# Patient Record
Sex: Female | Born: 1965
Health system: Southern US, Community
[De-identification: ages and names within clinical notes are randomized; demographics above are authoritative.]

## PROBLEM LIST (undated history)

## (undated) DIAGNOSIS — I499 Cardiac arrhythmia, unspecified: Secondary | ICD-10-CM

## (undated) DIAGNOSIS — F32A Depression, unspecified: Secondary | ICD-10-CM

## (undated) DIAGNOSIS — Z87442 Personal history of urinary calculi: Secondary | ICD-10-CM

## (undated) DIAGNOSIS — G4730 Sleep apnea, unspecified: Secondary | ICD-10-CM

## (undated) DIAGNOSIS — K219 Gastro-esophageal reflux disease without esophagitis: Secondary | ICD-10-CM

## (undated) DIAGNOSIS — J302 Other seasonal allergic rhinitis: Secondary | ICD-10-CM

## (undated) DIAGNOSIS — E119 Type 2 diabetes mellitus without complications: Secondary | ICD-10-CM

## (undated) DIAGNOSIS — T7840XA Allergy, unspecified, initial encounter: Secondary | ICD-10-CM

## (undated) DIAGNOSIS — F329 Major depressive disorder, single episode, unspecified: Secondary | ICD-10-CM

## (undated) DIAGNOSIS — M5090 Cervical disc disorder, unspecified, unspecified cervical region: Secondary | ICD-10-CM

## (undated) DIAGNOSIS — T753XXA Motion sickness, initial encounter: Secondary | ICD-10-CM

## (undated) DIAGNOSIS — E785 Hyperlipidemia, unspecified: Secondary | ICD-10-CM

## (undated) DIAGNOSIS — N200 Calculus of kidney: Secondary | ICD-10-CM

## (undated) DIAGNOSIS — R002 Palpitations: Secondary | ICD-10-CM

## (undated) DIAGNOSIS — F419 Anxiety disorder, unspecified: Secondary | ICD-10-CM

## (undated) DIAGNOSIS — M51369 Other intervertebral disc degeneration, lumbar region without mention of lumbar back pain or lower extremity pain: Secondary | ICD-10-CM

## (undated) DIAGNOSIS — M5136 Other intervertebral disc degeneration, lumbar region: Secondary | ICD-10-CM

## (undated) HISTORY — DX: Major depressive disorder, single episode, unspecified: F32.9

## (undated) HISTORY — PX: OTHER SURGICAL HISTORY: SHX169

## (undated) HISTORY — DX: Gastro-esophageal reflux disease without esophagitis: K21.9

## (undated) HISTORY — DX: Depression, unspecified: F32.A

## (undated) HISTORY — DX: Sleep apnea, unspecified: G47.30

## (undated) HISTORY — DX: Cardiac arrhythmia, unspecified: I49.9

## (undated) HISTORY — DX: Type 2 diabetes mellitus without complications: E11.9

## (undated) HISTORY — DX: Hyperlipidemia, unspecified: E78.5

## (undated) HISTORY — DX: Anxiety disorder, unspecified: F41.9

## (undated) HISTORY — PX: JOINT REPLACEMENT: SHX530

## (undated) HISTORY — DX: Calculus of kidney: N20.0

## (undated) HISTORY — DX: Allergy, unspecified, initial encounter: T78.40XA

## (undated) HISTORY — PX: CYST EXCISION: SHX5701

---

## 2003-06-24 DIAGNOSIS — F4320 Adjustment disorder, unspecified: Secondary | ICD-10-CM | POA: Insufficient documentation

## 2003-06-24 DIAGNOSIS — F430 Acute stress reaction: Secondary | ICD-10-CM | POA: Insufficient documentation

## 2003-06-24 DIAGNOSIS — E7800 Pure hypercholesterolemia, unspecified: Secondary | ICD-10-CM | POA: Insufficient documentation

## 2005-05-23 HISTORY — PX: CYST EXCISION: SHX5701

## 2007-03-26 ENCOUNTER — Ambulatory Visit: Payer: Self-pay | Admitting: Family Medicine

## 2007-03-26 DIAGNOSIS — K5730 Diverticulosis of large intestine without perforation or abscess without bleeding: Secondary | ICD-10-CM | POA: Insufficient documentation

## 2007-04-09 DIAGNOSIS — K219 Gastro-esophageal reflux disease without esophagitis: Secondary | ICD-10-CM | POA: Insufficient documentation

## 2008-01-23 DIAGNOSIS — Z87891 Personal history of nicotine dependence: Secondary | ICD-10-CM | POA: Insufficient documentation

## 2008-01-23 DIAGNOSIS — Z720 Tobacco use: Secondary | ICD-10-CM | POA: Insufficient documentation

## 2009-01-15 ENCOUNTER — Ambulatory Visit: Payer: Self-pay | Admitting: Gastroenterology

## 2009-02-14 ENCOUNTER — Ambulatory Visit: Payer: Self-pay | Admitting: Unknown Physician Specialty

## 2009-03-27 ENCOUNTER — Ambulatory Visit: Payer: Self-pay | Admitting: Anesthesiology

## 2009-04-15 ENCOUNTER — Ambulatory Visit: Payer: Self-pay | Admitting: Anesthesiology

## 2009-05-21 ENCOUNTER — Ambulatory Visit: Payer: Self-pay | Admitting: Anesthesiology

## 2009-07-15 ENCOUNTER — Ambulatory Visit: Payer: Self-pay | Admitting: Anesthesiology

## 2013-06-19 LAB — HM PAP SMEAR: HM PAP: NEGATIVE

## 2013-10-28 ENCOUNTER — Encounter: Payer: Self-pay | Admitting: Podiatry

## 2013-11-04 ENCOUNTER — Ambulatory Visit (INDEPENDENT_AMBULATORY_CARE_PROVIDER_SITE_OTHER): Payer: BC Managed Care – PPO

## 2013-11-04 ENCOUNTER — Ambulatory Visit (INDEPENDENT_AMBULATORY_CARE_PROVIDER_SITE_OTHER): Payer: BC Managed Care – PPO | Admitting: Podiatry

## 2013-11-04 ENCOUNTER — Other Ambulatory Visit: Payer: Self-pay | Admitting: *Deleted

## 2013-11-04 ENCOUNTER — Encounter: Payer: Self-pay | Admitting: Podiatry

## 2013-11-04 VITALS — BP 113/67 | HR 92 | Resp 16 | Ht 66.0 in | Wt 225.0 lb

## 2013-11-04 DIAGNOSIS — M722 Plantar fascial fibromatosis: Secondary | ICD-10-CM

## 2013-11-04 MED ORDER — METHYLPREDNISOLONE (PAK) 4 MG PO TABS: ORAL_TABLET | ORAL | Status: DC

## 2013-11-04 MED ORDER — MELOXICAM 15 MG PO TABS: 15.0000 mg | ORAL_TABLET | Freq: Every day | ORAL | Status: DC

## 2013-11-04 NOTE — Progress Notes (Signed)
   Subjective:    Patient ID: Heather Orr, female    DOB: [birth year], 48 y.o.   MRN: [id number]  HPI Comments: Both my feet hurt and mainly it is the right foot. Its in the heels and on the side of my foot, its been going on since [place] or February. Constant hard pain. Eases a little bit when walking. Getting out of the bed is the worse   Foot Pain Associated symptoms include abdominal pain.      Review of Systems  HENT:       Ringing in the ears  Eyes: Positive for itching.  Cardiovascular: Positive for leg swelling.  Gastrointestinal: Positive for abdominal pain and diarrhea.  Musculoskeletal: Positive for back pain.       Muscle pain Difficulty walking   All other systems reviewed and are negative.      Objective:   Physical Exam: I have reviewed her past medical history medications allergies surgeries social history and review systems. Pulses are strongly palpable bilateral. Neurologic sensorium is intact per Semmes-Weinstein monofilament. Deep tendon reflexes are intact bilateral and equal. Muscle strength + over 5 dorsiflexors plantar flexors inverters everters all intrinsic musculature is intact. Orthopedic evaluation demonstrates all joints distal to the ankle a full range of motion without crepitation. She has pain on palpation medial calcaneal tubercle right foot greater than left. Radiographic evaluation does demonstrate a soft tissue increase in density at the plantar fascial calcaneal insertion site right greater than left.        Assessment & Plan:  Assessment: Plantar fasciitis right greater than left.  Plan: Injected the right heel today. Put her in a plantar fascial strapping. Wrote a prescription for Medrol Dosepak to be followed by meloxicam. Discussed appropriate shoe gear stretching exercises ice therapy and shoe gear modifications. I will followup with her in one month. We discussed appropriate shoe gear stretching exercises ice therapy shoe gear  modifications.

## 2013-11-04 NOTE — Patient Instructions (Signed)

## 2013-11-19 ENCOUNTER — Encounter: Payer: Self-pay | Admitting: *Deleted

## 2013-12-02 ENCOUNTER — Ambulatory Visit: Payer: BC Managed Care – PPO | Admitting: General Surgery

## 2013-12-02 ENCOUNTER — Encounter: Payer: Self-pay | Admitting: Podiatry

## 2013-12-02 ENCOUNTER — Ambulatory Visit (INDEPENDENT_AMBULATORY_CARE_PROVIDER_SITE_OTHER): Payer: BC Managed Care – PPO | Admitting: Podiatry

## 2013-12-02 VITALS — BP 135/76 | HR 89 | Resp 12

## 2013-12-02 DIAGNOSIS — M722 Plantar fascial fibromatosis: Secondary | ICD-10-CM

## 2013-12-02 NOTE — Progress Notes (Signed)
She presents today for followup of plantar fasciitis right foot. She states it is approximately 80% better but the lateral aspect of the foot is starting to hurt.  Objective: Pulses are palpable. She's been on palpation medial continued tubercle and pain on palpation to the lateral calcaneal tubercle indicative of plantar fasciitis with lateral compensatory syndrome.  Assessment: Plantar fasciitis slowly responding right with lateral compensatory syndrome right.  Plan: Reinjected right heel today continue all conservative therapies followup with me in one month.

## 2013-12-10 ENCOUNTER — Encounter: Payer: Self-pay | Admitting: General Surgery

## 2013-12-10 ENCOUNTER — Ambulatory Visit (INDEPENDENT_AMBULATORY_CARE_PROVIDER_SITE_OTHER): Payer: BC Managed Care – PPO | Admitting: General Surgery

## 2013-12-10 VITALS — BP 174/94 | HR 82 | Resp 12 | Ht 66.0 in | Wt 224.0 lb

## 2013-12-10 DIAGNOSIS — D179 Benign lipomatous neoplasm, unspecified: Secondary | ICD-10-CM

## 2013-12-10 NOTE — Patient Instructions (Signed)
Patient advised to start monthly breast checks. Patient to return as needed.The patient is aware to call back for any questions or concerns.
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
## 2013-12-11 DIAGNOSIS — D179 Benign lipomatous neoplasm, unspecified: Secondary | ICD-10-CM | POA: Insufficient documentation

## 2013-12-30 ENCOUNTER — Ambulatory Visit: Payer: BC Managed Care – PPO | Admitting: Podiatry

## 2013-12-31 ENCOUNTER — Encounter: Payer: Self-pay | Admitting: General Surgery

## 2014-03-24 ENCOUNTER — Encounter: Payer: Self-pay | Admitting: General Surgery

## 2014-11-19 ENCOUNTER — Other Ambulatory Visit: Payer: Self-pay | Admitting: Podiatry

## 2014-11-19 NOTE — Telephone Encounter (Signed)
Pt will need an appt prior to future refills.

## 2014-12-24 ENCOUNTER — Ambulatory Visit (INDEPENDENT_AMBULATORY_CARE_PROVIDER_SITE_OTHER): Payer: BLUE CROSS/BLUE SHIELD | Admitting: Podiatry

## 2014-12-24 ENCOUNTER — Ambulatory Visit (INDEPENDENT_AMBULATORY_CARE_PROVIDER_SITE_OTHER): Payer: BLUE CROSS/BLUE SHIELD

## 2014-12-24 VITALS — BP 122/75 | HR 84 | Resp 16

## 2014-12-24 DIAGNOSIS — M722 Plantar fascial fibromatosis: Secondary | ICD-10-CM

## 2014-12-24 DIAGNOSIS — Q6652 Congenital pes planus, left foot: Secondary | ICD-10-CM | POA: Diagnosis not present

## 2014-12-24 DIAGNOSIS — M76822 Posterior tibial tendinitis, left leg: Secondary | ICD-10-CM

## 2014-12-24 NOTE — Progress Notes (Signed)
She presents today for follow-up of her left foot and ankle pain. She states that her left ankle is started swelling and it has been for a while. She states is tender right in here and she points to the posterior tibial tendon as it courses beneath the medial malleolus. She also states that she still has heel pain. She continues to use her night splint as well as her plantar fascial brace. She continues the use of meloxicam.  Objective: Vital signs are stable she is alert and oriented 3. Pulses are palpable. She has pain on palpation of the posterior tibial tendon as well as pain on palpation medial calcaneal tubercle.  Assessment: Plantar fasciitis left with compensatory posterior tibial tendinitis left.  Plan: Discussed etiology pathology conservative versus surgical therapies. At this point I injected the posterior tibial tendon sheath with dexamethasone and local and aesthetic as well as Kenalog and local and aesthetic to the plantar fascial calcaneal insertion site left. She will continue all conservative therapies and consider custom molded orthotics follow up with her in 1 month if necessary

## 2014-12-29 ENCOUNTER — Other Ambulatory Visit: Payer: Self-pay | Admitting: Family Medicine

## 2014-12-29 DIAGNOSIS — F419 Anxiety disorder, unspecified: Secondary | ICD-10-CM

## 2014-12-29 NOTE — Telephone Encounter (Signed)
Last OV 10/2013  Thanks,   -[place] Marchiano  

## 2014-12-29 NOTE — Telephone Encounter (Signed)
Ok to call in rx as above. Needs ov before next refill. Thanks.

## 2014-12-29 NOTE — Telephone Encounter (Signed)
OK to refill as above. E

## 2015-01-21 ENCOUNTER — Ambulatory Visit (INDEPENDENT_AMBULATORY_CARE_PROVIDER_SITE_OTHER): Payer: BLUE CROSS/BLUE SHIELD | Admitting: Podiatry

## 2015-01-21 ENCOUNTER — Encounter: Payer: Self-pay | Admitting: Podiatry

## 2015-01-21 VITALS — BP 136/77 | HR 86 | Resp 16

## 2015-01-21 DIAGNOSIS — M722 Plantar fascial fibromatosis: Secondary | ICD-10-CM | POA: Diagnosis not present

## 2015-01-21 MED ORDER — DICLOFENAC SODIUM 75 MG PO TBEC: 75.0000 mg | DELAYED_RELEASE_TABLET | Freq: Two times a day (BID) | ORAL | Status: DC

## 2015-01-21 NOTE — Progress Notes (Signed)
She presents today for follow-up of her plantar fasciitis left posterior tibial tendinitis left. She states that I guess it's getting better. She states she started to have pain in her right heel now. She denies any trauma. Rates that she continues to take her medication and use her sling.  Objective: Vital signs are stable she is alert and oriented 3. Pulses are strongly palpable bilateral. Neurologic sensorium is intact percent YC monofilament. Deep tendon reflexes are intact bilateral muscle strength +5 over 5 dorsiflexion plantar flexors and inverters everters all his musculature is intact. Orthopedic evaluation of his wrist pain on palpation medial calcaneal tubercles bilateral. Mild tenderness on palpation of the posterior tibial tendon left.  Assessment: Plantar fasciitis bilateral compensatory posterior tibial tendinitis left.  Plan: Reinjected bilateral heels today with Kenalog multiple aesthetic continue plantar fascial braces and night splint. Continue non-steroidal anti-inflammatory. Follow-up with her 1 month.

## 2015-02-18 ENCOUNTER — Ambulatory Visit: Payer: BLUE CROSS/BLUE SHIELD | Admitting: Podiatry

## 2015-02-24 ENCOUNTER — Other Ambulatory Visit: Payer: Self-pay | Admitting: Family Medicine

## 2015-02-24 DIAGNOSIS — F4322 Adjustment disorder with anxiety: Secondary | ICD-10-CM

## 2015-02-24 NOTE — Telephone Encounter (Signed)
Last OV 10/2013  Thanks,   -Kadeja Granada  

## 2015-05-19 ENCOUNTER — Other Ambulatory Visit: Payer: Self-pay

## 2015-05-19 DIAGNOSIS — N926 Irregular menstruation, unspecified: Secondary | ICD-10-CM | POA: Insufficient documentation

## 2015-05-19 DIAGNOSIS — M722 Plantar fascial fibromatosis: Secondary | ICD-10-CM | POA: Insufficient documentation

## 2015-05-19 DIAGNOSIS — L301 Dyshidrosis [pompholyx]: Secondary | ICD-10-CM | POA: Insufficient documentation

## 2015-05-20 ENCOUNTER — Encounter: Payer: Self-pay | Admitting: Physician Assistant

## 2015-05-20 ENCOUNTER — Ambulatory Visit (INDEPENDENT_AMBULATORY_CARE_PROVIDER_SITE_OTHER): Payer: BLUE CROSS/BLUE SHIELD | Admitting: Physician Assistant

## 2015-05-20 VITALS — BP 124/82 | HR 84 | Temp 98.8°F | Resp 16 | Wt 226.0 lb

## 2015-05-20 DIAGNOSIS — G4700 Insomnia, unspecified: Secondary | ICD-10-CM | POA: Diagnosis not present

## 2015-05-20 DIAGNOSIS — W540XXA Bitten by dog, initial encounter: Secondary | ICD-10-CM | POA: Diagnosis not present

## 2015-05-20 DIAGNOSIS — Z23 Encounter for immunization: Secondary | ICD-10-CM | POA: Diagnosis not present

## 2015-05-20 DIAGNOSIS — S91352A Open bite, left foot, initial encounter: Secondary | ICD-10-CM | POA: Diagnosis not present

## 2015-05-20 MED ORDER — TRAZODONE HCL 150 MG PO TABS: 150.0000 mg | ORAL_TABLET | Freq: Every evening | ORAL | Status: DC | PRN

## 2015-05-20 MED ORDER — DOXYCYCLINE HYCLATE 100 MG PO TABS: 100.0000 mg | ORAL_TABLET | Freq: Two times a day (BID) | ORAL | Status: DC

## 2015-05-20 MED ORDER — SILVER SULFADIAZINE 1 % EX CREA: 1.0000 "application " | TOPICAL_CREAM | Freq: Every day | CUTANEOUS | Status: DC

## 2015-05-20 NOTE — Progress Notes (Signed)
Patient ID: Heather Orr, female   DOB: 03/26/1966, 49 y.o.   MRN: [id number]       [name] Female    DOB: February 25, 1966   49 y.o.   MRN: [id number] Visit Date: 05/20/2015  Today's Provider: Mar Daring, PA-C   Chief Complaint  Patient presents with  . Animal Bite   Subjective:    HPI  Patient had a dog bite on December 25th, her dogs were fighting and her left foot got injured. Dogs are up to date on their immunizations. She can not recall when she had last tetanus shot. Her left foot is red, swollen, some pus present, pain is present and pain is radiating up to her left lower leg. No fever that she can tell. She has been keeping area clean with soap, alcohol rub and peroxide. She has been wrapping her left foot. She used RX antibiotic that her girlfriend had after been bit by a cat, she has also used wound care spray. She has noticed some odor from discharge from wound.    Allergies  Allergen Reactions  . Nexium [Esomeprazole Magnesium] Rash   Previous Medications   ALPRAZOLAM (XANAX) 0.25 MG TABLET    TAKE 1 TABLET BY MOUTH EVERY 8 HOURS AS NEEDED   CITALOPRAM (CELEXA) 40 MG TABLET    TAKE 1 TABLET BY MOUTH EVERYDAY   FLUOCINONIDE EX       GLUCOSAMINE-CHONDROIT-VIT C-MN (FLEX-DS PO)    Take by mouth.   MELOXICAM (MOBIC) 15 MG TABLET    TAKE 1 TABLET (15 MG TOTAL) BY MOUTH DAILY.   OMEGA-3 FATTY ACIDS (OMEGA 3 PO)    Take 1 capsule by mouth daily.   OXYCODONE-ACETAMINOPHEN (PERCOCET/ROXICET) 5-325 MG TABLET    Take by mouth. Reported on 05/20/2015   RANITIDINE (ZANTAC) 150 MG TABLET    Take by mouth. Reported on 05/20/2015   TRAZODONE (DESYREL) 150 MG TABLET       VITAMIN D, CHOLECALCIFEROL, PO    Take 4,000 mg by mouth daily.    Review of Systems  Constitutional: Negative.   Respiratory: Negative.   Cardiovascular: Negative.   Musculoskeletal: Positive for joint swelling and arthralgias.  Skin: Positive for color change and wound.    Social History    Substance Use Topics  . Smoking status: Current Every Day Smoker -- 1.00 packs/day for 25 years    Types: Cigarettes  . Smokeless tobacco: Never Used  . Alcohol Use: Yes   Objective:   BP 124/82 mmHg  Pulse 84  Temp(Src) 98.8 F (37.1 C)  Resp 16  Wt 226 lb (102.513 kg)  Physical Exam  Constitutional: She appears well-developed and well-nourished. No distress.  Cardiovascular: Normal rate, regular rhythm, normal heart sounds and intact distal pulses.  Exam reveals no gallop and no friction rub.   No murmur heard. Pulmonary/Chest: Effort normal and breath sounds normal. No respiratory distress. She has no wheezes. She has no rales.  Skin: Laceration noted. She is not diaphoretic. There is erythema.     Vitals reviewed.       Assessment & Plan:     1. Dog bite of foot, left, initial encounter Wound culture was obtained. Wound was then debrided of a eschar tissue using forceps and scissors. Silver nitrate was then used in the wound. The wound was then cleaned and pat dry. I then placed silver cream into the wound and covered with a nonadherent pad and wrapped with Coban. I will also get  doxycycline as below because of the signs of infection. I advised her how to cleanse the wound with warm soap and water. I also advised her to not use the antibiotic ointment or peroxide on the wound any longer. I did prescribe Silvadene cream as below she is to put this on the wound after washing and cleansing the wound. I will see her back in 2 weeks to reevaluate. She is to call the office if the area worsens, discharge increases, pain increases or if she develops systemic signs of infection such as fever, chills, nausea or vomiting. - Wound culture - silver sulfADIAZINE (SILVADENE) 1 % cream; Apply 1 application topically daily.  Dispense: 50 g; Refill: 0 - doxycycline (VIBRA-TABS) 100 MG tablet; Take 1 tablet (100 mg total) by mouth 2 (two) times daily.  Dispense: 20 tablet; Refill: 0  2. Need  for diphtheria-tetanus-pertussis (Tdap) vaccine T death vaccine was given without complication for the dog bite. - Tdap vaccine greater than or equal to 7yo IM  3. Insomnia Stable currently. Diagnosis was pulled to refill trazodone as below. - traZODone (DESYREL) 150 MG tablet; Take 1 tablet (150 mg total) by mouth at bedtime as needed for sleep.  Dispense: 30 tablet; Refill: [place], PA-C  [facility]

## 2015-05-20 NOTE — Patient Instructions (Signed)

## 2015-05-23 LAB — WOUND CULTURE

## 2015-05-26 ENCOUNTER — Telehealth: Payer: Self-pay

## 2015-05-26 NOTE — Telephone Encounter (Signed)
Lost connection, patient will call back.

## 2015-05-26 NOTE — Telephone Encounter (Signed)
Advised patient of results.  

## 2015-05-26 NOTE — Telephone Encounter (Signed)
-----   Message from Mar Daring, [place] sent at 05/26/2015  9:23 AM EST ----- Culture grew out few gram-negative rods as well as just normal skin flora. Continue current antibiotic treatment until completed.

## 2015-06-03 ENCOUNTER — Ambulatory Visit: Payer: BLUE CROSS/BLUE SHIELD | Admitting: Physician Assistant

## 2015-08-14 ENCOUNTER — Encounter: Payer: Self-pay | Admitting: Family Medicine

## 2015-08-14 ENCOUNTER — Ambulatory Visit (INDEPENDENT_AMBULATORY_CARE_PROVIDER_SITE_OTHER): Payer: BLUE CROSS/BLUE SHIELD | Admitting: Family Medicine

## 2015-08-14 VITALS — BP 116/74 | HR 72 | Temp 97.8°F | Resp 16 | Ht 66.0 in | Wt 238.0 lb

## 2015-08-14 DIAGNOSIS — Z1239 Encounter for other screening for malignant neoplasm of breast: Secondary | ICD-10-CM

## 2015-08-14 DIAGNOSIS — Z1211 Encounter for screening for malignant neoplasm of colon: Secondary | ICD-10-CM | POA: Diagnosis not present

## 2015-08-14 DIAGNOSIS — E7800 Pure hypercholesterolemia, unspecified: Secondary | ICD-10-CM

## 2015-08-14 DIAGNOSIS — J3089 Other allergic rhinitis: Secondary | ICD-10-CM | POA: Diagnosis not present

## 2015-08-14 DIAGNOSIS — R0789 Other chest pain: Secondary | ICD-10-CM | POA: Diagnosis not present

## 2015-08-14 DIAGNOSIS — M1990 Unspecified osteoarthritis, unspecified site: Secondary | ICD-10-CM

## 2015-08-14 DIAGNOSIS — Z0000 Encounter for general adult medical examination without abnormal findings: Secondary | ICD-10-CM

## 2015-08-14 DIAGNOSIS — Z720 Tobacco use: Secondary | ICD-10-CM

## 2015-08-14 LAB — POCT URINALYSIS DIPSTICK
Bilirubin, UA: NEGATIVE
Glucose, UA: NEGATIVE
KETONES UA: NEGATIVE
Leukocytes, UA: NEGATIVE
Nitrite, UA: NEGATIVE
PH UA: 6
PROTEIN UA: NEGATIVE
RBC UA: NEGATIVE
SPEC GRAV UA: 1.01
UROBILINOGEN UA: 0.2

## 2015-08-14 MED ORDER — NAPROXEN 500 MG PO TABS: 500.0000 mg | ORAL_TABLET | Freq: Two times a day (BID) | ORAL | Status: DC

## 2015-08-14 MED ORDER — FLUTICASONE PROPIONATE 50 MCG/ACT NA SUSP: 2.0000 | Freq: Every day | NASAL | Status: DC
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
## 2015-08-28 ENCOUNTER — Other Ambulatory Visit: Payer: Self-pay

## 2015-08-28 ENCOUNTER — Telehealth: Payer: Self-pay

## 2015-08-28 NOTE — Telephone Encounter (Signed)
Gastroenterology Pre-Procedure Review  Request Date: 09/28/15 Requesting Physician: Dr. Venia Minks   PATIENT REVIEW QUESTIONS: The patient responded to the following health history questions as indicated:    1. Are you having any GI issues? no 2. Do you have a personal history of Polyps? no 3. Do you have a family history of Colon Cancer or Polyps? no 4. Diabetes Mellitus? no 5. Joint replacements in the past 12 months?no 6. Major health problems in the past 3 months?no 7. Any artificial heart valves, MVP, or defibrillator?no    MEDICATIONS & ALLERGIES:    Patient reports the following regarding taking any anticoagulation/antiplatelet therapy:   Plavix, Coumadin, Eliquis, Xarelto, Lovenox, Pradaxa, Brilinta, or Effient? no Aspirin? no  Patient confirms/reports the following medications:  Current Outpatient Prescriptions  Medication Sig Dispense Refill  . ALPRAZolam (XANAX) 0.25 MG tablet TAKE 1 TABLET BY MOUTH EVERY 8 HOURS AS NEEDED 30 tablet 0  . citalopram (CELEXA) 40 MG tablet TAKE 1 TABLET BY MOUTH EVERYDAY 30 tablet 0  . Flaxseed, Linseed, (FLAX SEED OIL) 1000 MG CAPS Take 1 capsule by mouth daily.    . fluticasone (FLONASE) 50 MCG/ACT nasal spray Place 2 sprays into both nostrils daily. 16 g 6  . meloxicam (MOBIC) 15 MG tablet TAKE 1 TABLET (15 MG TOTAL) BY MOUTH DAILY. 30 tablet 5  . naproxen (NAPROSYN) 500 MG tablet Take 1 tablet (500 mg total) by mouth 2 (two) times daily with a meal. 30 tablet 5  . Omega-3 Fatty Acids (OMEGA 3 PO) Take 1 capsule by mouth daily.    Marland Kitchen oxyCODONE-acetaminophen (PERCOCET/ROXICET) 5-325 MG tablet Take 1 tablet by mouth as needed. Reported on 05/20/2015    . traZODone (DESYREL) 150 MG tablet Take 1 tablet (150 mg total) by mouth at bedtime as needed for sleep. 30 tablet 5  . VITAMIN D, CHOLECALCIFEROL, PO Take 4,000 mg by mouth daily.     No current facility-administered medications for this visit.    Patient confirms/reports the following  allergies:  Allergies  Allergen Reactions  . Nexium [Esomeprazole Magnesium] Rash    No orders of the defined types were placed in this encounter.    AUTHORIZATION INFORMATION Primary Insurance: 1D#: Group #:  Secondary Insurance: 1D#: Group #:  SCHEDULE INFORMATION: Date: 09/28/15 Time: Location: [place]

## 2015-09-03 ENCOUNTER — Encounter: Payer: Self-pay | Admitting: Family Medicine

## 2015-09-04 LAB — LIPID PANEL WITH LDL/HDL RATIO
CHOLESTEROL TOTAL: 227 mg/dL — AB (ref 100–199)
HDL: 44 mg/dL (ref >39)
LDL Calculated: 141 mg/dL — ABNORMAL HIGH (ref 0–99)
LDL/HDL RATIO: 3.2 ratio (ref 0.0–3.2)
TRIGLYCERIDES: 208 mg/dL — AB (ref 0–149)
VLDL Cholesterol Cal: 42 mg/dL — ABNORMAL HIGH (ref 5–40)

## 2015-09-04 LAB — CBC WITH DIFFERENTIAL/PLATELET
Basophils Absolute: 0 10*3/uL (ref 0.0–0.2)
Basos: 0 %
EOS (ABSOLUTE): 0.2 10*3/uL (ref 0.0–0.4)
Eos: 2 %
Hematocrit: 44.5 % (ref 34.0–46.6)
Hemoglobin: 15.2 g/dL (ref 11.1–15.9)
IMMATURE GRANULOCYTES: 0 %
Immature Grans (Abs): 0 10*3/uL (ref 0.0–0.1)
Lymphocytes Absolute: 3.9 10*3/uL — ABNORMAL HIGH (ref 0.7–3.1)
Lymphs: 46 %
MCH: 30.7 pg (ref 26.6–33.0)
MCHC: 34.2 g/dL (ref 31.5–35.7)
MCV: 90 fL (ref 79–97)
MONOS ABS: 0.5 10*3/uL (ref 0.1–0.9)
Monocytes: 6 %
NEUTROS PCT: 46 %
Neutrophils Absolute: 3.9 10*3/uL (ref 1.4–7.0)
PLATELETS: 350 10*3/uL (ref 150–379)
RBC: 4.95 x10E6/uL (ref 3.77–5.28)
RDW: 13.3 % (ref 12.3–15.4)
WBC: 8.5 10*3/uL (ref 3.4–10.8)

## 2015-09-04 LAB — COMPREHENSIVE METABOLIC PANEL
A/G RATIO: 1.6 (ref 1.2–2.2)
ALK PHOS: 70 IU/L (ref 39–117)
ALT: 28 IU/L (ref 0–32)
AST: 18 IU/L (ref 0–40)
Albumin: 4 g/dL (ref 3.5–5.5)
BUN/Creatinine Ratio: 21 (ref 9–23)
BUN: 13 mg/dL (ref 6–24)
Bilirubin Total: 0.3 mg/dL (ref 0.0–1.2)
CALCIUM: 9.9 mg/dL (ref 8.7–10.2)
CO2: 25 mmol/L (ref 18–29)
CREATININE: 0.62 mg/dL (ref 0.57–1.00)
Chloride: 101 mmol/L (ref 96–106)
GFR calc Af Amer: 122 mL/min/{1.73_m2} (ref >59)
GFR, EST NON AFRICAN AMERICAN: 105 mL/min/{1.73_m2} (ref >59)
Globulin, Total: 2.5 g/dL (ref 1.5–4.5)
Glucose: 94 mg/dL (ref 65–99)
POTASSIUM: 4.8 mmol/L (ref 3.5–5.2)
Sodium: 142 mmol/L (ref 134–144)
Total Protein: 6.5 g/dL (ref 6.0–8.5)

## 2015-09-04 LAB — TSH: TSH: 1.23 u[IU]/mL (ref 0.450–4.500)

## 2015-09-07 ENCOUNTER — Telehealth: Payer: Self-pay

## 2015-09-07 NOTE — Telephone Encounter (Signed)
-----   Message from Margarita Rana, MD sent at 09/06/2015  7:13 AM EDT ----- Labs stable. Cholesterol is elevated with 10 year risk of heart disease or 5 percent. Not high enough to recommend a medication, but  Could be much lower with healthy diet, exercise and no smoking.Recheck annually.  Thanks.

## 2015-09-07 NOTE — Telephone Encounter (Signed)
Pt advised.   Thanks,   -Laura  

## 2015-09-30 ENCOUNTER — Encounter: Payer: Self-pay | Admitting: *Deleted

## 2015-10-01 NOTE — Discharge Instructions (Signed)

## 2015-10-05 ENCOUNTER — Encounter: Payer: Self-pay | Admitting: *Deleted

## 2015-10-05 ENCOUNTER — Encounter
Admission: RE | Disposition: A | Discharge status: Home / Self Care | Payer: Self-pay | Source: Ambulatory Visit | Attending: Gastroenterology

## 2015-10-05 ENCOUNTER — Ambulatory Visit: Payer: BLUE CROSS/BLUE SHIELD | Admitting: Anesthesiology

## 2015-10-05 ENCOUNTER — Ambulatory Visit
Admission: RE | Admit: 2015-10-05 | Discharge: 2015-10-05 | Disposition: A | Discharge status: Home / Self Care | Payer: BLUE CROSS/BLUE SHIELD | Source: Ambulatory Visit | Attending: Gastroenterology | Admitting: Gastroenterology

## 2015-10-05 DIAGNOSIS — M5136 Other intervertebral disc degeneration, lumbar region: Secondary | ICD-10-CM | POA: Diagnosis not present

## 2015-10-05 DIAGNOSIS — Z7951 Long term (current) use of inhaled steroids: Secondary | ICD-10-CM | POA: Insufficient documentation

## 2015-10-05 DIAGNOSIS — Z79891 Long term (current) use of opiate analgesic: Secondary | ICD-10-CM | POA: Insufficient documentation

## 2015-10-05 DIAGNOSIS — Z818 Family history of other mental and behavioral disorders: Secondary | ICD-10-CM | POA: Diagnosis not present

## 2015-10-05 DIAGNOSIS — Z79899 Other long term (current) drug therapy: Secondary | ICD-10-CM | POA: Diagnosis not present

## 2015-10-05 DIAGNOSIS — Z791 Long term (current) use of non-steroidal anti-inflammatories (NSAID): Secondary | ICD-10-CM | POA: Diagnosis not present

## 2015-10-05 DIAGNOSIS — Z8249 Family history of ischemic heart disease and other diseases of the circulatory system: Secondary | ICD-10-CM | POA: Diagnosis not present

## 2015-10-05 DIAGNOSIS — D125 Benign neoplasm of sigmoid colon: Secondary | ICD-10-CM | POA: Diagnosis not present

## 2015-10-05 DIAGNOSIS — K5730 Diverticulosis of large intestine without perforation or abscess without bleeding: Secondary | ICD-10-CM | POA: Diagnosis not present

## 2015-10-05 DIAGNOSIS — F17210 Nicotine dependence, cigarettes, uncomplicated: Secondary | ICD-10-CM | POA: Diagnosis not present

## 2015-10-05 DIAGNOSIS — Z1211 Encounter for screening for malignant neoplasm of colon: Secondary | ICD-10-CM | POA: Insufficient documentation

## 2015-10-05 DIAGNOSIS — Z833 Family history of diabetes mellitus: Secondary | ICD-10-CM | POA: Insufficient documentation

## 2015-10-05 DIAGNOSIS — K641 Second degree hemorrhoids: Secondary | ICD-10-CM | POA: Insufficient documentation

## 2015-10-05 DIAGNOSIS — F329 Major depressive disorder, single episode, unspecified: Secondary | ICD-10-CM | POA: Insufficient documentation

## 2015-10-05 HISTORY — PX: COLONOSCOPY WITH PROPOFOL: SHX5780

## 2015-10-05 HISTORY — DX: Cervical disc disorder, unspecified, unspecified cervical region: M50.90

## 2015-10-05 HISTORY — PX: POLYPECTOMY: SHX5525

## 2015-10-05 HISTORY — DX: Other intervertebral disc degeneration, lumbar region: M51.36

## 2015-10-05 HISTORY — DX: Other intervertebral disc degeneration, lumbar region without mention of lumbar back pain or lower extremity pain: M51.369

## 2015-10-05 HISTORY — DX: Motion sickness, initial encounter: T75.3XXA

## 2015-10-05 SURGERY — COLONOSCOPY WITH PROPOFOL
Anesthesia: Monitor Anesthesia Care

## 2015-10-05 MED ORDER — LACTATED RINGERS IV SOLN
INTRAVENOUS | Status: DC
  Administered 2015-10-05: 10:00:00 via INTRAVENOUS

## 2015-10-05 MED ORDER — LIDOCAINE HCL (CARDIAC) 20 MG/ML IV SOLN
INTRAVENOUS | Status: DC | PRN
  Administered 2015-10-05: 20 mg via INTRAVENOUS

## 2015-10-05 MED ORDER — PROPOFOL 10 MG/ML IV BOLUS
INTRAVENOUS | Status: DC | PRN
  Administered 2015-10-05: 25 mg via INTRAVENOUS
  Administered 2015-10-05: 100 mg via INTRAVENOUS
  Administered 2015-10-05: 50 mg via INTRAVENOUS
  Administered 2015-10-05: 25 mg via INTRAVENOUS

## 2015-10-05 MED ORDER — STERILE WATER FOR IRRIGATION IR SOLN
Status: DC | PRN
  Administered 2015-10-05: 10:00:00

## 2015-10-05 SURGICAL SUPPLY — 22 items
CANISTER SUCT 1200ML W/VALVE (MISCELLANEOUS) ×3 IMPLANT
CLIP HMST 235XBRD CATH ROT (MISCELLANEOUS) IMPLANT
CLIP RESOLUTION 360 11X235 (MISCELLANEOUS)
FCP ESCP3.2XJMB 240X2.8X (MISCELLANEOUS)
FORCEPS BIOP RAD 4 LRG CAP 4 (CUTTING FORCEPS) IMPLANT
FORCEPS BIOP RJ4 240 W/NDL (MISCELLANEOUS)
FORCEPS ESCP3.2XJMB 240X2.8X (MISCELLANEOUS) IMPLANT
GOWN CVR UNV OPN BCK APRN NK (MISCELLANEOUS) ×4 IMPLANT
GOWN ISOL THUMB LOOP REG UNIV (MISCELLANEOUS) ×2
INJECTOR VARIJECT VIN23 (MISCELLANEOUS) IMPLANT
KIT DEFENDO VALVE AND CONN (KITS) IMPLANT
KIT ENDO PROCEDURE OLY (KITS) ×3 IMPLANT
MARKER SPOT ENDO TATTOO 5ML (MISCELLANEOUS) IMPLANT
PAD GROUND ADULT SPLIT (MISCELLANEOUS) IMPLANT
PROBE APC STR FIRE (PROBE) IMPLANT
SNARE SHORT THROW 13M SML OVAL (MISCELLANEOUS) IMPLANT
SNARE SHORT THROW 30M LRG OVAL (MISCELLANEOUS) ×3 IMPLANT
SNARE SNG USE RND 15MM (INSTRUMENTS) IMPLANT
SPOT EX ENDOSCOPIC TATTOO (MISCELLANEOUS)
TRAP ETRAP POLY (MISCELLANEOUS) ×6 IMPLANT
VARIJECT INJECTOR VIN23 (MISCELLANEOUS)
WATER STERILE IRR 250ML POUR (IV SOLUTION) ×3 IMPLANT

## 2015-10-05 NOTE — Transfer of Care (Signed)
Immediate Anesthesia Transfer of Care Note  [name]  Procedure(s) Performed: Procedure(s): COLONOSCOPY WITH PROPOFOL (N/A) POLYPECTOMY  Patient Location: PACU  Anesthesia Type: MAC  Level of Consciousness: awake, alert  and patient cooperative  Airway and Oxygen Therapy: Patient Spontanous Breathing and Patient connected to supplemental oxygen  Post-op Assessment: Post-op Vital signs reviewed, Patient's Cardiovascular Status Stable, Respiratory Function Stable, Patent Airway and No signs of Nausea or vomiting  Post-op Vital Signs: Reviewed and stable  Complications: No apparent anesthesia complications

## 2015-10-05 NOTE — Anesthesia Postprocedure Evaluation (Signed)
Anesthesia Post Note  [name]  Procedure(s) Performed: Procedure(s) (LRB): COLONOSCOPY WITH PROPOFOL (N/A) POLYPECTOMY  Patient location during evaluation: PACU Anesthesia Type: MAC Level of consciousness: awake and alert Pain management: pain level controlled Vital Signs Assessment: post-procedure vital signs reviewed and stable Respiratory status: spontaneous breathing, nonlabored ventilation, respiratory function stable and patient connected to nasal cannula oxygen Cardiovascular status: stable and blood pressure returned to baseline Anesthetic complications: no    Leaann Nevils C
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
## 2015-10-05 NOTE — Anesthesia Procedure Notes (Signed)
Procedure Name: MAC Performed by: Theora Vankirk Pre-anesthesia Checklist: Patient identified, Emergency Drugs available, Suction available, Patient being monitored and Timeout performed Patient Re-evaluated:Patient Re-evaluated prior to inductionOxygen Delivery Method: Nasal cannula       

## 2015-10-05 NOTE — Op Note (Signed)
[facility] Gastroenterology Patient Name: Heather Orr Procedure Date: 10/05/2015 9:46 AM MRN: [id number] Account #: [id number] Date of Birth: 08/10/1965 Admit Type: Outpatient Age: 50 Room: [facility] OR ROOM 01 Gender: Female Note Status: Finalized Procedure:            Colonoscopy Indications:          Screening for colorectal malignant neoplasm Providers:            Lucilla Lame, MD Referring MD:         Jerrell Belfast, MD (Referring MD) Medicines:            Propofol per Anesthesia Complications:        No immediate complications. Procedure:            Pre-Anesthesia Assessment:                       - Prior to the procedure, a History and Physical was                        performed, and patient medications and allergies were                        reviewed. The patient's tolerance of previous                        anesthesia was also reviewed. The risks and benefits of                        the procedure and the sedation options and risks were                        discussed with the patient. All questions were                        answered, and informed consent was obtained. Prior                        Anticoagulants: The patient has taken no previous                        anticoagulant or antiplatelet agents. ASA Grade                        Assessment: II - A patient with mild systemic disease.                        After reviewing the risks and benefits, the patient was                        deemed in satisfactory condition to undergo the                        procedure.                       After obtaining informed consent, the colonoscope was                        passed under direct vision. Throughout the procedure,  the patient's blood pressure, pulse, and oxygen                        saturations were monitored continuously. The Olympus                        CF-HQ190L Colonoscope (S#. [id number]) was introduced                         through the anus and advanced to the the cecum,                        identified by appendiceal orifice and ileocecal valve.                        The colonoscopy was performed without difficulty. The                        patient tolerated the procedure well. The quality of                        the bowel preparation was excellent. Findings:      The perianal and digital rectal examinations were normal.      Multiple small-mouthed diverticula were found in the entire colon.      Three sessile polyps were found in the sigmoid colon. The polyps were 5       to 9 mm in size. These polyps were removed with a cold snare. Resection       and retrieval were complete.      Non-bleeding internal hemorrhoids were found during retroflexion. The       hemorrhoids were Grade II (internal hemorrhoids that prolapse but reduce       spontaneously). Impression:           - Diverticulosis in the entire examined colon.                       - Three 5 to 9 mm polyps in the sigmoid colon, removed                        with a cold snare. Resected and retrieved.                       - Non-bleeding internal hemorrhoids. Recommendation:       - Await pathology results.                       - Repeat colonoscopy in 5 years if polyp adenoma and 10                        years if hyperplastic Procedure Code(s):    --- Professional ---                       [phone], Colonoscopy, flexible; with removal of tumor(s),                        polyp(s), or other lesion(s) by snare technique Diagnosis Code(s):    --- Professional ---  Z12.11, Encounter for screening for malignant neoplasm                        of colon                       D12.5, Benign neoplasm of sigmoid colon CPT copyright 2016 American Medical Association. All rights reserved. The codes documented in this report are preliminary and upon coder review may  be revised to meet current compliance  requirements. Lucilla Lame, MD 10/05/2015 10:16:03 AM This report has been signed electronically. Number of Addenda: 0 Note Initiated On: 10/05/2015 9:46 AM Scope Withdrawal Time: 0 hours 7 minutes 24 seconds  Total Procedure Duration: 0 hours 12 minutes 18 seconds       [facility]

## 2015-10-05 NOTE — Anesthesia Preprocedure Evaluation (Signed)
Anesthesia Evaluation  Patient identified by MRN, date of birth, ID band Patient awake    Reviewed: Allergy & Precautions, H&P , NPO status , Patient's Chart, lab work & pertinent test results, reviewed documented beta blocker date and time   Airway Mallampati: II  TM Distance: >3 FB Neck ROM: full    Dental no notable dental hx.    Pulmonary neg pulmonary ROS, Current Smoker,    Pulmonary exam normal breath sounds clear to auscultation       Cardiovascular Exercise Tolerance: Good negative cardio ROS   Rhythm:regular Rate:Normal     Neuro/Psych Depression negative neurological ROS  negative psych ROS   GI/Hepatic negative GI ROS, Neg liver ROS, GERD  Controlled,  Endo/Other  negative endocrine ROS  Renal/GU negative Renal ROSKidney stones  negative genitourinary   Musculoskeletal  (+) Arthritis ,   Abdominal   Peds negative pediatric ROS (+)  Hematology negative hematology ROS (+)   Anesthesia Other Findings   Reproductive/Obstetrics negative OB ROS                             Anesthesia Physical Anesthesia Plan  ASA: II  Anesthesia Plan: MAC   Post-op Pain Management:    Induction: Intravenous  Airway Management Planned:   Additional Equipment:   Intra-op Plan:   Post-operative Plan: Extubation in OR  Informed Consent: I have reviewed the patients History and Physical, chart, labs and discussed the procedure including the risks, benefits and alternatives for the proposed anesthesia with the patient or authorized representative who has indicated his/her understanding and acceptance.   Dental advisory given  Plan Discussed with: CRNA  Anesthesia Plan Comments:         Anesthesia Quick Evaluation

## 2015-10-06 ENCOUNTER — Encounter: Payer: Self-pay | Admitting: Gastroenterology

## 2015-10-08 ENCOUNTER — Encounter: Payer: Self-pay | Admitting: Gastroenterology

## 2015-10-12 ENCOUNTER — Other Ambulatory Visit: Payer: Self-pay | Admitting: Family Medicine

## 2015-10-12 DIAGNOSIS — F4322 Adjustment disorder with anxiety: Secondary | ICD-10-CM

## 2016-06-30 ENCOUNTER — Other Ambulatory Visit: Payer: Self-pay

## 2016-06-30 DIAGNOSIS — M1990 Unspecified osteoarthritis, unspecified site: Secondary | ICD-10-CM

## 2016-06-30 MED ORDER — NAPROXEN 500 MG PO TABS: 500.0000 mg | ORAL_TABLET | Freq: Two times a day (BID) | ORAL | 5 refills | Status: DC

## 2016-06-30 NOTE — Telephone Encounter (Signed)
Refill request for Naproxen 500 MG tablets

## 2016-09-20 ENCOUNTER — Encounter (INDEPENDENT_AMBULATORY_CARE_PROVIDER_SITE_OTHER): Payer: Self-pay | Admitting: Orthopaedic Surgery

## 2016-09-20 ENCOUNTER — Ambulatory Visit (INDEPENDENT_AMBULATORY_CARE_PROVIDER_SITE_OTHER): Payer: 59

## 2016-09-20 ENCOUNTER — Ambulatory Visit (INDEPENDENT_AMBULATORY_CARE_PROVIDER_SITE_OTHER): Payer: 59 | Admitting: Orthopaedic Surgery

## 2016-09-20 ENCOUNTER — Other Ambulatory Visit (INDEPENDENT_AMBULATORY_CARE_PROVIDER_SITE_OTHER): Payer: Self-pay

## 2016-09-20 DIAGNOSIS — M25551 Pain in right hip: Secondary | ICD-10-CM
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
## 2016-09-26 ENCOUNTER — Other Ambulatory Visit (INDEPENDENT_AMBULATORY_CARE_PROVIDER_SITE_OTHER): Payer: Self-pay | Admitting: Orthopaedic Surgery

## 2016-09-26 ENCOUNTER — Telehealth (INDEPENDENT_AMBULATORY_CARE_PROVIDER_SITE_OTHER): Payer: Self-pay | Admitting: *Deleted

## 2016-09-26 DIAGNOSIS — M25551 Pain in right hip: Secondary | ICD-10-CM

## 2016-09-26 NOTE — Telephone Encounter (Signed)
No, we have never seen her for her ankle. So we don't really have anyway to back up a precert.

## 2016-09-26 NOTE — Telephone Encounter (Signed)
Pt called wanted to know if Dr. Ninfa Linden call put in an order to have Left ankle MRI as well, says her doctor in [facility] wanted her to have done and since she has to have MRI hip was hoping she can get these both done at the same time.  I advised the pt if her doctor in [place] had requested then he would have to put in the order but she stated she also mentioned to [facility] as well.   ? Ok to put in order for MRI L ankle.  Call back# (W) [phone]                   (c)   [phone]

## 2016-09-27 NOTE — Telephone Encounter (Signed)
Pt aware needs to call provider in [place] that recommended MRi ankle and have him to put in order

## 2016-09-28 ENCOUNTER — Telehealth (INDEPENDENT_AMBULATORY_CARE_PROVIDER_SITE_OTHER): Payer: Self-pay | Admitting: Orthopaedic Surgery

## 2016-09-28 NOTE — Telephone Encounter (Signed)
I spoke with patient regarding need for records to be sent to [facility].  I faxed her a Medical records release form to complete and will fax records when I get her signed release form back

## 2016-10-04 ENCOUNTER — Ambulatory Visit (INDEPENDENT_AMBULATORY_CARE_PROVIDER_SITE_OTHER): Payer: BLUE CROSS/BLUE SHIELD | Admitting: Orthopaedic Surgery

## 2016-10-10 ENCOUNTER — Other Ambulatory Visit (INDEPENDENT_AMBULATORY_CARE_PROVIDER_SITE_OTHER): Payer: Self-pay | Admitting: Orthopaedic Surgery

## 2016-10-12 ENCOUNTER — Ambulatory Visit
Admission: RE | Admit: 2016-10-12 | Discharge: 2016-10-12 | Disposition: A | Discharge status: Home / Self Care | Payer: 59 | Source: Ambulatory Visit | Attending: Orthopaedic Surgery | Admitting: Orthopaedic Surgery

## 2016-10-12 ENCOUNTER — Ambulatory Visit
Admission: RE | Admit: 2016-10-12 | Discharge: 2016-10-12 | Disposition: A | Discharge status: Home / Self Care | Payer: Self-pay | Source: Ambulatory Visit | Attending: Orthopaedic Surgery | Admitting: Orthopaedic Surgery

## 2016-10-12 DIAGNOSIS — M25551 Pain in right hip: Secondary | ICD-10-CM | POA: Diagnosis not present

## 2016-10-12 DIAGNOSIS — M1611 Unilateral primary osteoarthritis, right hip: Secondary | ICD-10-CM | POA: Diagnosis not present

## 2016-10-12 MED ORDER — IOPAMIDOL (ISOVUE-M 200) INJECTION 41%
15.0000 mL | Freq: Once | INTRAMUSCULAR | Status: AC
  Administered 2016-10-12: 15 mL via INTRA_ARTICULAR

## 2016-10-19 ENCOUNTER — Other Ambulatory Visit (INDEPENDENT_AMBULATORY_CARE_PROVIDER_SITE_OTHER): Payer: Self-pay

## 2016-10-19 ENCOUNTER — Ambulatory Visit (INDEPENDENT_AMBULATORY_CARE_PROVIDER_SITE_OTHER): Payer: 59 | Admitting: Orthopaedic Surgery

## 2016-10-19 DIAGNOSIS — M25551 Pain in right hip: Secondary | ICD-10-CM | POA: Diagnosis not present

## 2016-10-19 DIAGNOSIS — M1611 Unilateral primary osteoarthritis, right hip: Secondary | ICD-10-CM | POA: Diagnosis not present

## 2016-10-19 NOTE — Progress Notes (Signed)
The patient is following up after MRI of her right hip. She has had no snapping and issues in her hip in the past until a mechanical fall earlier this year. She family with a cane. 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 We sent her for an MRI of her right hip due to the severity of her pain. She still having a significant amount of pain in her right hip. She family with a cane. It is detrimentally affecting her activities daily living, her quality of life, and her mobility.  On examination she still has pain with interaction rotation of the right hip seems quite severe. MRI is reviewed with her and it does show moderate arthritis with high-grade partial-thickness loss of the femoral head and the acetabulum. There is subchondral edema in the femoral head as well. There is no evidence of pathology in the hip in terms of lesions. There is no evidence of fracture or avascular necrosis.  At this point I'll like her to try one time intra-articular steroid injection in her right hip and she is agreeable to this. We'll set this up under fluoroscopy with Dr. Ernestina Patches. I'll see her back myself in 4 weeks and see how she is done with this. I did talk to her about the potential hip replacement surgery in the future and gave her handout on anterior hip replacement surgery. All questions were encouraged and answered.

## 2016-11-01 ENCOUNTER — Ambulatory Visit (INDEPENDENT_AMBULATORY_CARE_PROVIDER_SITE_OTHER): Payer: 59

## 2016-11-01 ENCOUNTER — Ambulatory Visit (INDEPENDENT_AMBULATORY_CARE_PROVIDER_SITE_OTHER): Payer: 59 | Admitting: Physical Medicine and Rehabilitation

## 2016-11-01 DIAGNOSIS — M25551 Pain in right hip: Secondary | ICD-10-CM | POA: Diagnosis not present

## 2016-11-01 NOTE — Patient Instructions (Signed)
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
## 2016-11-01 NOTE — Progress Notes (Deleted)
Right hip and groin pain for a couple of months. Pretty constant. Worse with stairs. Left hip starting to bother her as well.

## 2016-11-02 MED ORDER — BUPIVACAINE HCL 0.5 % IJ SOLN
3.0000 mL | INTRAMUSCULAR | Status: AC | PRN
Start: 2016-11-01 — End: 2016-11-01
  Administered 2016-11-01: 3 mL via INTRA_ARTICULAR

## 2016-11-02 MED ORDER — TRIAMCINOLONE ACETONIDE 40 MG/ML IJ SUSP
80.0000 mg | INTRAMUSCULAR | Status: AC | PRN
  Administered 2016-11-01: 80 mg via INTRA_ARTICULAR

## 2016-11-16 ENCOUNTER — Ambulatory Visit (INDEPENDENT_AMBULATORY_CARE_PROVIDER_SITE_OTHER): Payer: 59 | Admitting: Orthopaedic Surgery

## 2016-11-16 DIAGNOSIS — M25551 Pain in right hip: Secondary | ICD-10-CM | POA: Diagnosis not present
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
## 2016-12-02 DIAGNOSIS — M1710 Unilateral primary osteoarthritis, unspecified knee: Secondary | ICD-10-CM | POA: Diagnosis not present

## 2016-12-07 ENCOUNTER — Ambulatory Visit (INDEPENDENT_AMBULATORY_CARE_PROVIDER_SITE_OTHER): Payer: 59 | Admitting: Orthopaedic Surgery

## 2016-12-15 DIAGNOSIS — M1710 Unilateral primary osteoarthritis, unspecified knee: Secondary | ICD-10-CM | POA: Insufficient documentation

## 2016-12-15 DIAGNOSIS — M1712 Unilateral primary osteoarthritis, left knee: Secondary | ICD-10-CM | POA: Diagnosis not present

## 2016-12-15 DIAGNOSIS — M179 Osteoarthritis of knee, unspecified: Secondary | ICD-10-CM | POA: Insufficient documentation

## 2017-01-17 ENCOUNTER — Encounter: Payer: Self-pay | Admitting: Emergency Medicine

## 2017-01-17 ENCOUNTER — Emergency Department
Admission: EM | Admit: 2017-01-17 | Discharge: 2017-01-17 | Disposition: A | Discharge status: Home / Self Care | Payer: 59 | Attending: Emergency Medicine | Admitting: Emergency Medicine

## 2017-01-17 ENCOUNTER — Emergency Department: Payer: 59

## 2017-01-17 DIAGNOSIS — K219 Gastro-esophageal reflux disease without esophagitis: Secondary | ICD-10-CM | POA: Diagnosis not present

## 2017-01-17 DIAGNOSIS — Z791 Long term (current) use of non-steroidal anti-inflammatories (NSAID): Secondary | ICD-10-CM | POA: Diagnosis not present

## 2017-01-17 DIAGNOSIS — R079 Chest pain, unspecified: Secondary | ICD-10-CM | POA: Insufficient documentation

## 2017-01-17 DIAGNOSIS — Z79899 Other long term (current) drug therapy: Secondary | ICD-10-CM | POA: Insufficient documentation

## 2017-01-17 DIAGNOSIS — F17210 Nicotine dependence, cigarettes, uncomplicated: Secondary | ICD-10-CM | POA: Insufficient documentation

## 2017-01-17 DIAGNOSIS — R0602 Shortness of breath: Secondary | ICD-10-CM | POA: Diagnosis not present

## 2017-01-17 DIAGNOSIS — R0789 Other chest pain: Secondary | ICD-10-CM | POA: Diagnosis not present

## 2017-01-17 LAB — BASIC METABOLIC PANEL
Anion gap: 8 (ref 5–15)
BUN: 10 mg/dL (ref 6–20)
CHLORIDE: 106 mmol/L (ref 101–111)
CO2: 25 mmol/L (ref 22–32)
CREATININE: 0.75 mg/dL (ref 0.44–1.00)
Calcium: 9.6 mg/dL (ref 8.9–10.3)
GFR calc Af Amer: >60 mL/min (ref >60)
GFR calc non Af Amer: >60 mL/min (ref >60)
GLUCOSE: 99 mg/dL (ref 65–99)
POTASSIUM: 3.6 mmol/L (ref 3.5–5.1)
Sodium: 139 mmol/L (ref 135–145)

## 2017-01-17 LAB — CBC
HEMATOCRIT: 43.9 % (ref 35.0–47.0)
Hemoglobin: 15.2 g/dL (ref 12.0–16.0)
MCH: 30.9 pg (ref 26.0–34.0)
MCHC: 34.6 g/dL (ref 32.0–36.0)
MCV: 89.4 fL (ref 80.0–100.0)
PLATELETS: 345 10*3/uL (ref 150–440)
RBC: 4.91 MIL/uL (ref 3.80–5.20)
RDW: 14.1 % (ref 11.5–14.5)
WBC: 11.6 10*3/uL — ABNORMAL HIGH (ref 3.6–11.0)

## 2017-01-17 LAB — TROPONIN I
Troponin I: <0.03 ng/mL (ref <0.03)
Troponin I: <0.03 ng/mL (ref <0.03)

## 2017-01-17 MED ORDER — FAMOTIDINE 20 MG PO TABS: 20.0000 mg | ORAL_TABLET | Freq: Two times a day (BID) | ORAL | 0 refills | Status: DC

## 2017-01-17 MED ORDER — ALUM & MAG HYDROXIDE-SIMETH 200-200-20 MG/5ML PO SUSP
30.0000 mL | Freq: Once | ORAL | Status: AC
  Administered 2017-01-17: 30 mL via ORAL
  Filled 2017-01-17: qty 30

## 2017-01-17 MED ORDER — METOCLOPRAMIDE HCL 10 MG PO TABS
10.0000 mg | ORAL_TABLET | Freq: Once | ORAL | Status: AC
  Administered 2017-01-17: 10 mg via ORAL
  Filled 2017-01-17: qty 1

## 2017-01-17 MED ORDER — METOCLOPRAMIDE HCL 10 MG PO TABS: 10.0000 mg | ORAL_TABLET | Freq: Four times a day (QID) | ORAL | 0 refills | Status: DC | PRN

## 2017-01-17 MED ORDER — ALUMINUM-MAGNESIUM-SIMETHICONE 200-200-20 MG/5ML PO SUSP: 30.0000 mL | Freq: Three times a day (TID) | ORAL | 0 refills | Status: DC

## 2017-01-17 MED ORDER — FAMOTIDINE 20 MG PO TABS
40.0000 mg | ORAL_TABLET | Freq: Once | ORAL | Status: AC
  Administered 2017-01-17: 40 mg via ORAL
  Filled 2017-01-17: qty 2

## 2017-01-17 NOTE — ED Triage Notes (Addendum)
Arrives with C/O chest heaviness and some SOB.  Onset of symptoms today at 1500.  C/O heaviness from epigastric area to chin.  Reports similar episode a few days ago, symptoms resolved on their own.
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
## 2017-01-18 ENCOUNTER — Telehealth: Payer: Self-pay

## 2017-01-18 NOTE — Telephone Encounter (Signed)
Lmov for patient they were seen in ED need a fu appt Seen on 01/17/17 for CP   Will try again at a later time

## 2017-01-26 ENCOUNTER — Encounter: Payer: Self-pay | Admitting: Cardiovascular Disease

## 2017-01-26 ENCOUNTER — Ambulatory Visit (INDEPENDENT_AMBULATORY_CARE_PROVIDER_SITE_OTHER): Payer: 59 | Admitting: Cardiovascular Disease

## 2017-01-26 VITALS — BP 120/78 | HR 64 | Ht 66.0 in | Wt 236.2 lb

## 2017-01-26 DIAGNOSIS — Z720 Tobacco use: Secondary | ICD-10-CM | POA: Diagnosis not present

## 2017-01-26 DIAGNOSIS — R079 Chest pain, unspecified: Secondary | ICD-10-CM

## 2017-01-26 DIAGNOSIS — R002 Palpitations: Secondary | ICD-10-CM | POA: Diagnosis not present

## 2017-01-26 DIAGNOSIS — E7800 Pure hypercholesterolemia, unspecified: Secondary | ICD-10-CM | POA: Diagnosis not present

## 2017-01-26 NOTE — Patient Instructions (Signed)
Medication Instructions:   No medication changes made  Labwork:  No new labs needed  Testing/Procedures:  No further testing at this time  Please call if you would like CT coronary calcium score ($150) Or stress myoview, lexiscan (nuclear)   Follow-Up: It was a pleasure seeing you in the office today. Please call us if you have new issues that need to be addressed before your next appt.  [phone]  Your physician wants you to follow-up in:  As needed  If you need a refill on your cardiac medications before your next appointment, please call your pharmacy.
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
## 2017-02-15 ENCOUNTER — Ambulatory Visit (INDEPENDENT_AMBULATORY_CARE_PROVIDER_SITE_OTHER): Payer: 59

## 2017-02-15 ENCOUNTER — Ambulatory Visit (INDEPENDENT_AMBULATORY_CARE_PROVIDER_SITE_OTHER): Payer: 59 | Admitting: Orthopaedic Surgery

## 2017-02-15 ENCOUNTER — Telehealth (INDEPENDENT_AMBULATORY_CARE_PROVIDER_SITE_OTHER): Payer: Self-pay | Admitting: Physical Medicine and Rehabilitation

## 2017-02-15 ENCOUNTER — Other Ambulatory Visit (INDEPENDENT_AMBULATORY_CARE_PROVIDER_SITE_OTHER): Payer: Self-pay

## 2017-02-15 DIAGNOSIS — M25551 Pain in right hip: Secondary | ICD-10-CM | POA: Diagnosis not present

## 2017-02-15 DIAGNOSIS — M1611 Unilateral primary osteoarthritis, right hip: Secondary | ICD-10-CM

## 2017-02-15 NOTE — Telephone Encounter (Signed)
Right hip injection 11/01/16. Please advise.

## 2017-02-15 NOTE — Progress Notes (Signed)
The patient is well-known to me. She is very pleasant 51 year old female with debilitating right hip pain. We've already had an MRI of her hip showing high grade partial thickness cartilage loss of the right femoral head and right acetabulum with areas of reactive edema. She's had a steroid injection already and is working on weight loss and activity modification. She takes anti-inflammatories as well. It is gotten to where her pain is daily and is detrimentally affected activities daily living, her quality of life, and her mobility. We had a long and thorough discussion about hip replacement surgery but she still wanted him hold off on this right now. She does wish to consider another steroid injection in her hip sometime around the end of October 90 with this.  On exam she has significant pain with internal rotation rotation of her right hip. X-rays still show only minimal to moderate arthritic changes but it's the MRI that shows more significant disease in her right hip.  At this point we'll set the injection of the end of October. She'll otherwise follow up as needed. All questions and concerns were answered and addressed. If her pain gets bad enough to where she was to proceed with a hip replacement surgery would be happy to see her back anytime. She understands this well.

## 2017-02-15 NOTE — Telephone Encounter (Signed)
Patient would like to schedule an appointment with Dr. Ernestina Patches for an injection. CB # [phone]

## 2017-02-15 NOTE — Telephone Encounter (Signed)
She is seeing Dr. Ninfa Linden today by looking at her chart?

## 2017-03-27 ENCOUNTER — Ambulatory Visit (INDEPENDENT_AMBULATORY_CARE_PROVIDER_SITE_OTHER): Payer: 59 | Admitting: Physical Medicine and Rehabilitation

## 2017-05-08 ENCOUNTER — Encounter: Payer: Self-pay | Admitting: Physician Assistant

## 2017-05-08 ENCOUNTER — Ambulatory Visit (INDEPENDENT_AMBULATORY_CARE_PROVIDER_SITE_OTHER): Payer: 59 | Admitting: Physician Assistant

## 2017-05-08 VITALS — BP 120/80 | HR 72 | Temp 97.8°F | Resp 16 | Ht 66.0 in | Wt 240.0 lb

## 2017-05-08 DIAGNOSIS — L0293 Carbuncle, unspecified: Secondary | ICD-10-CM | POA: Diagnosis not present

## 2017-05-08 MED ORDER — DOXYCYCLINE HYCLATE 100 MG PO TABS: 100.0000 mg | ORAL_TABLET | Freq: Two times a day (BID) | ORAL | 0 refills | Status: DC

## 2017-05-08 NOTE — Patient Instructions (Signed)
Skin Abscess A skin abscess is an infected area on or under your skin that contains pus and other material. An abscess can happen almost anywhere on your body. Some abscesses break open (rupture) on their own. Most continue to get worse unless they are treated. The infection can spread deeper into the body and into your blood, which can make you feel sick. Treatment usually involves draining the abscess. Follow these instructions at home: Abscess Care  If you have an abscess that has not drained, place a warm, clean, wet washcloth over the abscess several times a day. Do this as told by your doctor.  Follow instructions from your doctor about how to take care of your abscess. Make sure you: ? Cover the abscess with a bandage (dressing). ? Change your bandage or gauze as told by your doctor. ? Wash your hands with soap and water before you change the bandage or gauze. If you cannot use soap and water, use hand sanitizer.  Check your abscess every day for signs that the infection is getting worse. Check for: ? More redness, swelling, or pain. ? More fluid or blood. ? Warmth. ? More pus or a bad smell. Medicines   Take over-the-counter and prescription medicines only as told by your doctor.  If you were prescribed an antibiotic medicine, take it as told by your doctor. Do not stop taking the antibiotic even if you start to feel better. General instructions  To avoid spreading the infection: ? Do not share personal care items, towels, or hot tubs with others. ? Avoid making skin-to-skin contact with other people.  Keep all follow-up visits as told by your doctor. This is important. Contact a doctor if:  You have more redness, swelling, or pain around your abscess.  You have more fluid or blood coming from your abscess.  Your abscess feels warm when you touch it.  You have more pus or a bad smell coming from your abscess.  You have a fever.  Your muscles ache.  You have  chills.  You feel sick. Get help right away if:  You have very bad (severe) pain.  You see red streaks on your skin spreading away from the abscess. This information is not intended to replace advice given to you by your health care provider. Make sure you discuss any questions you have with your health care provider. Document Released: 10/26/2007 Document Revised: 01/03/2016 Document Reviewed: 03/18/2015 Elsevier Interactive Patient Education  2018 [employer]  

## 2017-05-08 NOTE — Progress Notes (Signed)
[name] Female    DOB: [birth year]   52 y.o.   MRN: [id number] Visit Date: 05/08/2017  Today's Provider: Mar Daring, PA-C   Chief Complaint  Patient presents with  . Cyst   Subjective:    HPI Patient here today C/O a "spot" on her back x's several months to a years. Patient reports that spot started out as a "black head" and pt reports that last week spot was painful and had green discharge after being popped. Patient reports that spot is right on bra line, mid back. She does have an appt with [facility] next month.    Allergies  Allergen Reactions  . Nexium [Esomeprazole Magnesium] Shortness Of Breath    And hives     Current Outpatient Medications:  .  ALPRAZolam (XANAX) 0.25 MG tablet, TAKE 1 TABLET BY MOUTH EVERY 8 HOURS AS NEEDED, Disp: 30 tablet, Rfl: 0 .  aluminum-magnesium hydroxide-simethicone (MAALOX) [phone] MG/5ML SUSP, Take 30 mLs by mouth 4 (four) times daily -  before meals and at bedtime., Disp: 355 mL, Rfl: 0 .  APPLE CIDER VINEGAR PO, Take 450 mg by mouth daily., Disp: , Rfl:  .  citalopram (CELEXA) 40 MG tablet, TAKE ONE TABLET BY MOUTH ONCE DAILY. NEED APPT FOR MORE REFILLS, Disp: 90 tablet, Rfl: 1 .  Flaxseed, Linseed, (FLAX SEED OIL) 1000 MG CAPS, Take 1 capsule by mouth daily., Disp: , Rfl:  .  fluticasone (FLONASE) 50 MCG/ACT nasal spray, Place 2 sprays into both nostrils daily., Disp: 16 g, Rfl: 6 .  Omega-3 Fatty Acids (OMEGA 3 PO), Take 1 capsule by mouth daily., Disp: , Rfl:  .  traZODone (DESYREL) 150 MG tablet, Take 1 tablet (150 mg total) by mouth at bedtime as needed for sleep., Disp: 30 tablet, Rfl: 5 .  naproxen (NAPROSYN) 500 MG tablet, Take 1 tablet (500 mg total) by mouth 2 (two) times daily with a meal. (Patient not taking: Reported on 05/08/2017), Disp: 30 tablet, Rfl: 5 .  VITAMIN D, CHOLECALCIFEROL, PO, Take 4,000 mg by mouth daily., Disp: , Rfl:   Review of Systems  Constitutional: Negative.     Respiratory: Negative.   Cardiovascular: Negative.   Gastrointestinal: Negative.   Skin: Positive for wound. Negative for color change and rash.    Social History   Tobacco Use  . Smoking status: Current Every Day Smoker    Packs/day: 1.00    Years: 25.00    Pack years: 25.00    Types: Cigarettes  . Smokeless tobacco: Never Used  Substance Use Topics  . Alcohol use: Yes    Alcohol/week: 1.8 oz    Types: 3 Cans of beer per week   Objective:   BP 120/80 (BP Location: Left Arm, Patient Position: Sitting, Cuff Size: Large)   Pulse 72   Temp 97.8 F (36.6 C) (Oral)   Resp 16   Ht 5\' 6"  (1.676 m)   Wt 240 lb (108.9 kg)   SpO2 96%   BMI 38.74 kg/m  Vitals:   05/08/17 0910  BP: 120/80  Pulse: 72  Resp: 16  Temp: 97.8 F (36.6 C)  TempSrc: Oral  SpO2: 96%  Weight: 240 lb (108.9 kg)  Height: 5\' 6"  (1.676 m)     Physical Exam  Constitutional: She appears well-developed and well-nourished. No distress.  Neck: Normal range of motion. Neck supple.  Cardiovascular: Normal rate, regular rhythm and normal heart sounds. Exam reveals no  gallop and no friction rub.  No murmur heard. Pulmonary/Chest: Effort normal and breath sounds normal. No respiratory distress. She has no wheezes. She has no rales.  Skin: Lesion noted. She is not diaphoretic.     Vitals reviewed.       Assessment & Plan:     1. Carbuncle Unable to I&D today due to no area of fluctuation. Will treat with doxycycline as below. May use warm compresses. Advised to return if area returns.  - doxycycline (VIBRA-TABS) 100 MG tablet; Take 1 tablet (100 mg total) by mouth 2 (two) times daily.  Dispense: 20 tablet; Refill: 0       Mar Daring, PA-C  [facility]

## 2017-06-21 DIAGNOSIS — D229 Melanocytic nevi, unspecified: Secondary | ICD-10-CM | POA: Diagnosis not present

## 2017-06-21 DIAGNOSIS — D2221 Melanocytic nevi of right ear and external auricular canal: Secondary | ICD-10-CM | POA: Diagnosis not present

## 2017-06-21 DIAGNOSIS — D225 Melanocytic nevi of trunk: Secondary | ICD-10-CM | POA: Diagnosis not present

## 2017-06-21 DIAGNOSIS — L859 Epidermal thickening, unspecified: Secondary | ICD-10-CM | POA: Diagnosis not present

## 2017-06-28 DIAGNOSIS — L720 Epidermal cyst: Secondary | ICD-10-CM | POA: Diagnosis not present

## 2017-06-28 DIAGNOSIS — L820 Inflamed seborrheic keratosis: Secondary | ICD-10-CM | POA: Diagnosis not present

## 2017-07-05 ENCOUNTER — Ambulatory Visit (INDEPENDENT_AMBULATORY_CARE_PROVIDER_SITE_OTHER): Payer: 59 | Admitting: Family Medicine

## 2017-07-05 ENCOUNTER — Encounter: Payer: Self-pay | Admitting: Family Medicine

## 2017-07-05 VITALS — BP 134/82 | HR 91 | Temp 98.4°F | Resp 16 | Ht 66.0 in | Wt 245.2 lb

## 2017-07-05 DIAGNOSIS — Z124 Encounter for screening for malignant neoplasm of cervix: Secondary | ICD-10-CM

## 2017-07-05 DIAGNOSIS — E669 Obesity, unspecified: Secondary | ICD-10-CM

## 2017-07-05 DIAGNOSIS — Z0001 Encounter for general adult medical examination with abnormal findings: Secondary | ICD-10-CM | POA: Diagnosis not present

## 2017-07-05 DIAGNOSIS — Z720 Tobacco use: Secondary | ICD-10-CM

## 2017-07-05 DIAGNOSIS — M1611 Unilateral primary osteoarthritis, right hip: Secondary | ICD-10-CM

## 2017-07-05 DIAGNOSIS — Z0000 Encounter for general adult medical examination without abnormal findings: Secondary | ICD-10-CM

## 2017-07-05 DIAGNOSIS — Z1231 Encounter for screening mammogram for malignant neoplasm of breast: Secondary | ICD-10-CM

## 2017-07-05 DIAGNOSIS — Z6839 Body mass index (BMI) 39.0-39.9, adult: Secondary | ICD-10-CM | POA: Diagnosis not present

## 2017-07-05 DIAGNOSIS — F4322 Adjustment disorder with anxiety: Secondary | ICD-10-CM | POA: Diagnosis not present

## 2017-07-05 DIAGNOSIS — Z114 Encounter for screening for human immunodeficiency virus [HIV]: Secondary | ICD-10-CM

## 2017-07-05 MED ORDER — MELOXICAM 15 MG PO TABS: 15.0000 mg | ORAL_TABLET | Freq: Every day | ORAL | 3 refills | Status: DC

## 2017-07-05 NOTE — Assessment & Plan Note (Signed)
Discussed diet and exercise 
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
## 2017-07-05 NOTE — Assessment & Plan Note (Signed)
Encouraged patient to f/u with Ortho and that if all other interventions have failed and her quality of life is so effected, her only option is to consider joint replacement per Ortho recs Offered referral for second opinion, but patient declines as she fells better about the discussion about hip replacement after we discussed further

## 2017-07-05 NOTE — Assessment & Plan Note (Signed)
3-5 minute discussion regarding importance of smoking cessation and health effects of continued smoking Offered Chantix, but patient declines She will continue to cut back and use Nicotine gum prn

## 2017-07-05 NOTE — Assessment & Plan Note (Signed)
States this is related to stress from hip pain Celexa is working well She only takes Xanax once q1-2 months for very stressful situations

## 2017-07-05 NOTE — Patient Instructions (Signed)
Preventive Care 40-64 Years, Female Preventive care refers to lifestyle choices and visits with your health care provider that can promote health and wellness. What does preventive care include?  A yearly physical exam. This is also called an annual well check.  Dental exams once or twice a year.  Routine eye exams. Ask your health care provider how often you should have your eyes checked.  Personal lifestyle choices, including: ? Daily care of your teeth and gums. ? Regular physical activity. ? Eating a healthy diet. ? Avoiding tobacco and drug use. ? Limiting alcohol use. ? Practicing safe sex. ? Taking low-dose aspirin daily starting at age 58. ? Taking vitamin and mineral supplements as recommended by your health care provider. What happens during an annual well check? The services and screenings done by your health care provider during your annual well check will depend on your age, overall health, lifestyle risk factors, and family history of disease. Counseling Your health care provider may ask you questions about your:  Alcohol use.  Tobacco use.  Drug use.  Emotional well-being.  Home and relationship well-being.  Sexual activity.  Eating habits.  Work and work Statistician.  Method of birth control.  Menstrual cycle.  Pregnancy history.  Screening You may have the following tests or measurements:  Height, weight, and BMI.  Blood pressure.  Lipid and cholesterol levels. These may be checked every 5 years, or more frequently if you are over 81 years old.  Skin check.  Lung cancer screening. You may have this screening every year starting at age 78 if you have a 30-pack-year history of smoking and currently smoke or have quit within the past 15 years.  Fecal occult blood test (FOBT) of the stool. You may have this test every year starting at age 65.  Flexible sigmoidoscopy or colonoscopy. You may have a sigmoidoscopy every 5 years or a colonoscopy  every 10 years starting at age 30.  Hepatitis C blood test.  Hepatitis B blood test.  Sexually transmitted disease (STD) testing.  Diabetes screening. This is done by checking your blood sugar (glucose) after you have not eaten for a while (fasting). You may have this done every 1-3 years.  Mammogram. This may be done every 1-2 years. Talk to your health care provider about when you should start having regular mammograms. This may depend on whether you have a family history of breast cancer.  BRCA-related cancer screening. This may be done if you have a family history of breast, ovarian, tubal, or peritoneal cancers.  Pelvic exam and Pap test. This may be done every 3 years starting at age 80. Starting at age 36, this may be done every 5 years if you have a Pap test in combination with an HPV test.  Bone density scan. This is done to screen for osteoporosis. You may have this scan if you are at high risk for osteoporosis.  Discuss your test results, treatment options, and if necessary, the need for more tests with your health care provider. Vaccines Your health care provider may recommend certain vaccines, such as:  Influenza vaccine. This is recommended every year.  Tetanus, diphtheria, and acellular pertussis (Tdap, Td) vaccine. You may need a Td booster every 10 years.  Varicella vaccine. You may need this if you have not been vaccinated.  Zoster vaccine. You may need this after age 5.  Measles, mumps, and rubella (MMR) vaccine. You may need at least one dose of MMR if you were born in  1957 or later. You may also need a second dose.  Pneumococcal 13-valent conjugate (PCV13) vaccine. You may need this if you have certain conditions and were not previously vaccinated.  Pneumococcal polysaccharide (PPSV23) vaccine. You may need one or two doses if you smoke cigarettes or if you have certain conditions.  Meningococcal vaccine. You may need this if you have certain  conditions.  Hepatitis A vaccine. You may need this if you have certain conditions or if you travel or work in places where you may be exposed to hepatitis A.  Hepatitis B vaccine. You may need this if you have certain conditions or if you travel or work in places where you may be exposed to hepatitis B.  Haemophilus influenzae type b (Hib) vaccine. You may need this if you have certain conditions.  Talk to your health care provider about which screenings and vaccines you need and how often you need them. This information is not intended to replace advice given to you by your health care provider. Make sure you discuss any questions you have with your health care provider. Document Released: 06/05/2015 Document Revised: 01/27/2016 Document Reviewed: 03/10/2015 Elsevier Interactive Patient Education  2018 [employer]vier Inc[employer]  

## 2017-07-06 DIAGNOSIS — Z0000 Encounter for general adult medical examination without abnormal findings: Secondary | ICD-10-CM | POA: Diagnosis not present

## 2017-07-07 ENCOUNTER — Telehealth: Payer: Self-pay

## 2017-07-07 LAB — LIPID PANEL
CHOLESTEROL TOTAL: 223 mg/dL — AB (ref 100–199)
Chol/HDL Ratio: 5 ratio — ABNORMAL HIGH (ref 0.0–4.4)
HDL: 45 mg/dL (ref >39)
LDL CALC: 129 mg/dL — AB (ref 0–99)
TRIGLYCERIDES: 247 mg/dL — AB (ref 0–149)
VLDL CHOLESTEROL CAL: 49 mg/dL — AB (ref 5–40)

## 2017-07-07 LAB — CBC WITH DIFFERENTIAL/PLATELET
BASOS: 0 %
Basophils Absolute: 0 10*3/uL (ref 0.0–0.2)
EOS (ABSOLUTE): 0.2 10*3/uL (ref 0.0–0.4)
Eos: 2 %
Hematocrit: 44.9 % (ref 34.0–46.6)
Hemoglobin: 15.1 g/dL (ref 11.1–15.9)
IMMATURE GRANS (ABS): 0 10*3/uL (ref 0.0–0.1)
Immature Granulocytes: 0 %
LYMPHS ABS: 4.3 10*3/uL — AB (ref 0.7–3.1)
LYMPHS: 50 %
MCH: 30.4 pg (ref 26.6–33.0)
MCHC: 33.6 g/dL (ref 31.5–35.7)
MCV: 90 fL (ref 79–97)
Monocytes Absolute: 0.4 10*3/uL (ref 0.1–0.9)
Monocytes: 5 %
NEUTROS ABS: 3.7 10*3/uL (ref 1.4–7.0)
Neutrophils: 43 %
PLATELETS: 333 10*3/uL (ref 150–379)
RBC: 4.97 x10E6/uL (ref 3.77–5.28)
RDW: 13.5 % (ref 12.3–15.4)
WBC: 8.7 10*3/uL (ref 3.4–10.8)

## 2017-07-07 LAB — COMPREHENSIVE METABOLIC PANEL
ALK PHOS: 75 IU/L (ref 39–117)
ALT: 33 IU/L — AB (ref 0–32)
AST: 21 IU/L (ref 0–40)
Albumin/Globulin Ratio: 2 (ref 1.2–2.2)
Albumin: 4.5 g/dL (ref 3.5–5.5)
BUN/Creatinine Ratio: 17 (ref 9–23)
BUN: 12 mg/dL (ref 6–24)
Bilirubin Total: 0.4 mg/dL (ref 0.0–1.2)
CO2: 20 mmol/L (ref 20–29)
CREATININE: 0.7 mg/dL (ref 0.57–1.00)
Calcium: 9.7 mg/dL (ref 8.7–10.2)
Chloride: 104 mmol/L (ref 96–106)
GFR calc Af Amer: 115 mL/min/{1.73_m2} (ref >59)
GFR calc non Af Amer: 100 mL/min/{1.73_m2} (ref >59)
GLUCOSE: 95 mg/dL (ref 65–99)
Globulin, Total: 2.2 g/dL (ref 1.5–4.5)
Potassium: 4.5 mmol/L (ref 3.5–5.2)
Sodium: 139 mmol/L (ref 134–144)
Total Protein: 6.7 g/dL (ref 6.0–8.5)

## 2017-07-07 LAB — HIV ANTIBODY (ROUTINE TESTING W REFLEX): HIV Screen 4th Generation wRfx: NONREACTIVE

## 2017-07-07 NOTE — Telephone Encounter (Signed)
-----   Message from Virginia Crews, MD sent at 07/07/2017  8:19 AM EST ----- Neg HIV screening.  Normal kidney function, liver function, electrolytes, Blood counts. Cholesterol is slightly high.  10 year risk of heart disease/stroke is 5.8%.  This is not high enough to consider medication.  Recommend cutting back on saturated fats in diet and exercising regularly (30 min/day, 5 d/wk).  Will f/u with pap results when available  Bacigalupo, Dionne Bucy, MD, MPH [facility] 07/07/2017 8:19 AM

## 2017-07-07 NOTE — Telephone Encounter (Signed)
Pt advised and agrees with plan. 

## 2017-07-10 ENCOUNTER — Telehealth: Payer: Self-pay

## 2017-07-10 LAB — PAP IG, CT-NG NAA, HPV HIGH-RISK
Chlamydia, Nuc. Acid Amp: NEGATIVE
GONOCOCCUS BY NUCLEIC ACID AMP: NEGATIVE
HPV, high-risk: NEGATIVE
PAP Smear Comment: 0

## 2017-07-10 NOTE — Telephone Encounter (Signed)
Pt advised, and agrees with plan.

## 2017-07-10 NOTE — Telephone Encounter (Signed)
-----   Message from Virginia Crews, MD sent at 07/10/2017  2:04 PM EST ----- Normal pap smear.  HPV negative. GC/CT negative.  Next pap smear in 5 years.  Virginia Crews, MD, MPH [facility] 07/10/2017 2:04 PM

## 2017-07-24 DIAGNOSIS — Z1231 Encounter for screening mammogram for malignant neoplasm of breast: Secondary | ICD-10-CM | POA: Diagnosis not present

## 2017-07-24 LAB — HM MAMMOGRAPHY

## 2017-08-04 DIAGNOSIS — M5416 Radiculopathy, lumbar region: Secondary | ICD-10-CM | POA: Diagnosis not present

## 2017-08-04 DIAGNOSIS — M545 Low back pain: Secondary | ICD-10-CM | POA: Diagnosis not present

## 2017-08-04 DIAGNOSIS — G8929 Other chronic pain: Secondary | ICD-10-CM | POA: Diagnosis not present

## 2017-08-07 ENCOUNTER — Other Ambulatory Visit: Payer: Self-pay | Admitting: Student

## 2017-08-07 DIAGNOSIS — M5416 Radiculopathy, lumbar region: Secondary | ICD-10-CM

## 2017-08-09 DIAGNOSIS — M48061 Spinal stenosis, lumbar region without neurogenic claudication: Secondary | ICD-10-CM | POA: Diagnosis not present

## 2017-08-09 DIAGNOSIS — M4807 Spinal stenosis, lumbosacral region: Secondary | ICD-10-CM | POA: Diagnosis not present

## 2017-08-09 DIAGNOSIS — M5416 Radiculopathy, lumbar region: Secondary | ICD-10-CM | POA: Diagnosis not present

## 2017-08-11 ENCOUNTER — Ambulatory Visit: Payer: 59

## 2017-08-14 DIAGNOSIS — G8929 Other chronic pain: Secondary | ICD-10-CM | POA: Diagnosis not present

## 2017-08-14 DIAGNOSIS — M545 Low back pain: Secondary | ICD-10-CM | POA: Diagnosis not present

## 2017-08-18 DIAGNOSIS — M545 Low back pain: Secondary | ICD-10-CM | POA: Diagnosis not present

## 2017-08-18 DIAGNOSIS — G8929 Other chronic pain: Secondary | ICD-10-CM | POA: Diagnosis not present

## 2017-08-21 DIAGNOSIS — M545 Low back pain: Secondary | ICD-10-CM | POA: Diagnosis not present

## 2017-08-21 DIAGNOSIS — G8929 Other chronic pain: Secondary | ICD-10-CM | POA: Diagnosis not present

## 2017-08-23 DIAGNOSIS — G8929 Other chronic pain: Secondary | ICD-10-CM | POA: Diagnosis not present

## 2017-08-23 DIAGNOSIS — M545 Low back pain: Secondary | ICD-10-CM | POA: Diagnosis not present

## 2017-08-29 ENCOUNTER — Encounter (INDEPENDENT_AMBULATORY_CARE_PROVIDER_SITE_OTHER): Payer: Self-pay | Admitting: Orthopaedic Surgery

## 2017-08-29 ENCOUNTER — Ambulatory Visit (INDEPENDENT_AMBULATORY_CARE_PROVIDER_SITE_OTHER): Payer: 59 | Admitting: Orthopaedic Surgery

## 2017-08-29 DIAGNOSIS — M1611 Unilateral primary osteoarthritis, right hip: Secondary | ICD-10-CM
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
## 2017-08-30 DIAGNOSIS — G8929 Other chronic pain: Secondary | ICD-10-CM | POA: Diagnosis not present

## 2017-08-30 DIAGNOSIS — M545 Low back pain: Secondary | ICD-10-CM | POA: Diagnosis not present

## 2017-09-01 DIAGNOSIS — G8929 Other chronic pain: Secondary | ICD-10-CM | POA: Diagnosis not present

## 2017-09-01 DIAGNOSIS — M545 Low back pain: Secondary | ICD-10-CM | POA: Diagnosis not present

## 2017-09-07 ENCOUNTER — Other Ambulatory Visit (INDEPENDENT_AMBULATORY_CARE_PROVIDER_SITE_OTHER): Payer: Self-pay | Admitting: Physician Assistant

## 2017-09-12 ENCOUNTER — Other Ambulatory Visit (INDEPENDENT_AMBULATORY_CARE_PROVIDER_SITE_OTHER): Payer: Self-pay

## 2017-09-14 ENCOUNTER — Other Ambulatory Visit (HOSPITAL_COMMUNITY): Payer: Self-pay | Admitting: Emergency Medicine

## 2017-09-14 NOTE — Patient Instructions (Signed)
Heather Orr  09/14/2017   Your procedure is scheduled on: 09-22-17   Report to [facility]  Entrance    Report to admitting at 10:00AM    Call this number if you have problems the morning of surgery [phone]     Remember: Do not eat food or drink liquids :After Midnight.     Take these medicines the morning of surgery with A SIP OF WATER: TYLENOL IF NEEDED, XANAX IF NEEDED, CITALOPRAM                                You may not have any metal on your body including hair pins and              piercings  Do not wear jewelry, make-up, lotions, powders or perfumes, deodorant             Do not wear nail polish.  Do not shave  48 hours prior to surgery.               Do not bring valuables to the hospital. [place].  Contacts, dentures or bridgework may not be worn into surgery.  Leave suitcase in the car. After surgery it may be brought to your room.                 Please read over the following fact sheets you were given: _____________________________________________________________________             [facility] - Preparing for Surgery Before surgery, you can play an important role.  Because skin is not sterile, your skin needs to be as free of germs as possible.  You can reduce the number of germs on your skin by washing with CHG (chlorahexidine gluconate) soap before surgery.  CHG is an antiseptic cleaner which kills germs and bonds with the skin to continue killing germs even after washing. Please DO NOT use if you have an allergy to CHG or antibacterial soaps.  If your skin becomes reddened/irritated stop using the CHG and inform your nurse when you arrive at Short Stay. Do not shave (including legs and underarms) for at least 48 hours prior to the first CHG shower.  You may shave your face/neck. Please follow these instructions carefully:  1.  Shower with CHG Soap the night before surgery  and the  morning of Surgery.  2.  If you choose to wash your hair, wash your hair first as usual with your  normal  shampoo.  3.  After you shampoo, rinse your hair and body thoroughly to remove the  shampoo.                           4.  Use CHG as you would any other liquid soap.  You can apply chg directly  to the skin and wash                       Gently with a scrungie or clean washcloth.  5.  Apply the CHG Soap to your body ONLY FROM THE NECK DOWN.   Do not use on face/ open  Wound or open sores. Avoid contact with eyes, ears mouth and genitals (private parts).                       Wash face,  Genitals (private parts) with your normal soap.             6.  Wash thoroughly, paying special attention to the area where your surgery  will be performed.  7.  Thoroughly rinse your body with warm water from the neck down.  8.  DO NOT shower/wash with your normal soap after using and rinsing off  the CHG Soap.                9.  Pat yourself dry with a clean towel.            10.  Wear clean pajamas.            11.  Place clean sheets on your bed the night of your first shower and do not  sleep with pets. Day of Surgery : Do not apply any lotions/deodorants the morning of surgery.  Please wear clean clothes to the hospital/surgery center.  FAILURE TO FOLLOW THESE INSTRUCTIONS MAY RESULT IN THE CANCELLATION OF YOUR SURGERY PATIENT SIGNATURE_________________________________  NURSE SIGNATURE__________________________________  ________________________________________________________________________   Heather Orr  An incentive spirometer is a tool that can help keep your lungs clear and active. This tool measures how well you are filling your lungs with each breath. Taking long deep breaths may help reverse or decrease the chance of developing breathing (pulmonary) problems (especially infection) following:  A long period of time when you are unable to move or  be active. BEFORE THE PROCEDURE   If the spirometer includes an indicator to show your best effort, your nurse or respiratory therapist will set it to a desired goal.  If possible, sit up straight or lean slightly forward. Try not to slouch.  Hold the incentive spirometer in an upright position. INSTRUCTIONS FOR USE  1. Sit on the edge of your bed if possible, or sit up as far as you can in bed or on a chair. 2. Hold the incentive spirometer in an upright position. 3. Breathe out normally. 4. Place the mouthpiece in your mouth and seal your lips tightly around it. 5. Breathe in slowly and as deeply as possible, raising the piston or the ball toward the top of the column. 6. Hold your breath for 3-5 seconds or for as long as possible. Allow the piston or ball to fall to the bottom of the column. 7. Remove the mouthpiece from your mouth and breathe out normally. 8. Rest for a few seconds and repeat Steps 1 through 7 at least 10 times every 1-2 hours when you are awake. Take your time and take a few normal breaths between deep breaths. 9. The spirometer may include an indicator to show your best effort. Use the indicator as a goal to work toward during each repetition. 10. After each set of 10 deep breaths, practice coughing to be sure your lungs are clear. If you have an incision (the cut made at the time of surgery), support your incision when coughing by placing a pillow or rolled up towels firmly against it. Once you are able to get out of bed, walk around indoors and cough well. You may stop using the incentive spirometer when instructed by your caregiver.  RISKS AND COMPLICATIONS  Take your time so you do not get  dizzy or light-headed.  If you are in pain, you may need to take or ask for pain medication before doing incentive spirometry. It is harder to take a deep breath if you are having pain. AFTER USE  Rest and breathe slowly and easily.  It can be helpful to keep track of a log  of your progress. Your caregiver can provide you with a simple table to help with this. If you are using the spirometer at home, follow these instructions: [place] IF:   You are having difficultly using the spirometer.  You have trouble using the spirometer as often as instructed.  Your pain medication is not giving enough relief while using the spirometer.  You develop fever of 100.5 F (38.1 C) or higher. SEEK IMMEDIATE MEDICAL CARE IF:   You cough up bloody sputum that had not been present before.  You develop fever of 102 F (38.9 C) or greater.  You develop worsening pain at or near the incision site. MAKE SURE YOU:   Understand these instructions.  Will watch your condition.  Will get help right away if you are not doing well or get worse. Document Released: 09/19/2006 Document Revised: 08/01/2011 Document Reviewed: 11/20/2006 [facility] Patient Information 2014 [place], [place].   ________________________________________________________________________

## 2017-09-14 NOTE — Progress Notes (Signed)
EKG 01-26-17 Epic   LOV CARDIOLOGY DR TIM GOLLAN 01-26-17 Epic   CXR 01-17-17 Epic

## 2017-09-15 ENCOUNTER — Other Ambulatory Visit: Payer: Self-pay

## 2017-09-15 ENCOUNTER — Encounter (HOSPITAL_COMMUNITY): Payer: Self-pay

## 2017-09-15 ENCOUNTER — Encounter (HOSPITAL_COMMUNITY)
Admission: RE | Admit: 2017-09-15 | Discharge: 2017-09-15 | Disposition: A | Discharge status: Home / Self Care | Payer: 59 | Source: Ambulatory Visit | Attending: Orthopaedic Surgery | Admitting: Orthopaedic Surgery

## 2017-09-15 ENCOUNTER — Other Ambulatory Visit: Payer: Self-pay | Admitting: Family Medicine

## 2017-09-15 DIAGNOSIS — Z0183 Encounter for blood typing: Secondary | ICD-10-CM | POA: Insufficient documentation

## 2017-09-15 DIAGNOSIS — M1611 Unilateral primary osteoarthritis, right hip: Secondary | ICD-10-CM | POA: Diagnosis not present

## 2017-09-15 DIAGNOSIS — Z01812 Encounter for preprocedural laboratory examination: Secondary | ICD-10-CM | POA: Insufficient documentation

## 2017-09-15 HISTORY — DX: Palpitations: R00.2

## 2017-09-15 HISTORY — DX: Other seasonal allergic rhinitis: J30.2

## 2017-09-15 LAB — SURGICAL PCR SCREEN
MRSA, PCR: NEGATIVE
Staphylococcus aureus: NEGATIVE

## 2017-09-15 LAB — CBC
HCT: 44.4 % (ref 36.0–46.0)
Hemoglobin: 14.9 g/dL (ref 12.0–15.0)
MCH: 31 pg (ref 26.0–34.0)
MCHC: 33.6 g/dL (ref 30.0–36.0)
MCV: 92.3 fL (ref 78.0–100.0)
PLATELETS: 336 10*3/uL (ref 150–400)
RBC: 4.81 MIL/uL (ref 3.87–5.11)
RDW: 13.2 % (ref 11.5–15.5)
WBC: 8.3 10*3/uL (ref 4.0–10.5)

## 2017-09-15 LAB — ABO/RH: ABO/RH(D): O NEG

## 2017-09-18 NOTE — Telephone Encounter (Signed)
LOV 07/05/2017. Adaptation reaction was stable at that time, and she was advised to FU in 1 year.

## 2017-09-21 MED ORDER — TRANEXAMIC ACID 1000 MG/10ML IV SOLN
1000.0000 mg | INTRAVENOUS | Status: AC
  Administered 2017-09-22: 1000 mg via INTRAVENOUS
  Filled 2017-09-21: qty 10

## 2017-09-21 NOTE — Anesthesia Preprocedure Evaluation (Addendum)
Anesthesia Evaluation  Patient identified by MRN, date of birth, ID band Patient awake    Reviewed: Allergy & Precautions, NPO status , Patient's Chart, lab work & pertinent test results  Airway Mallampati: II  TM Distance: >3 FB Neck ROM: Full    Dental no notable dental hx.    Pulmonary Current Smoker,    Pulmonary exam normal breath sounds clear to auscultation       Cardiovascular Exercise Tolerance: Good negative cardio ROS Normal cardiovascular exam Rhythm:Regular Rate:Normal     Neuro/Psych Anxiety negative neurological ROS  negative psych ROS   GI/Hepatic Neg liver ROS, GERD  ,  Endo/Other  Morbid obesity  Renal/GU      Musculoskeletal   Abdominal   Peds negative pediatric ROS (+)  Hematology negative hematology ROS (+)   Anesthesia Other Findings   Reproductive/Obstetrics negative OB ROS                            Anesthesia Physical Anesthesia Plan  ASA: II  Anesthesia Plan: General   Post-op Pain Management:    Induction: Intravenous  PONV Risk Score and Plan: 2 and Treatment may vary due to age or medical condition, Ondansetron and Dexamethasone  Airway Management Planned: Oral ETT  Additional Equipment:   Intra-op Plan:   Post-operative Plan: Extubation in OR  Informed Consent: I have reviewed the patients History and Physical, chart, labs and discussed the procedure including the risks, benefits and alternatives for the proposed anesthesia with the patient or authorized representative who has indicated his/her understanding and acceptance.   Dental advisory given  Plan Discussed with: CRNA  Anesthesia Plan Comments:       Anesthesia Quick Evaluation

## 2017-09-22 ENCOUNTER — Other Ambulatory Visit: Payer: Self-pay

## 2017-09-22 ENCOUNTER — Encounter (HOSPITAL_COMMUNITY): Payer: Self-pay | Admitting: *Deleted

## 2017-09-22 ENCOUNTER — Inpatient Hospital Stay (HOSPITAL_COMMUNITY): Payer: 59

## 2017-09-22 ENCOUNTER — Inpatient Hospital Stay (HOSPITAL_COMMUNITY): Payer: 59 | Admitting: Certified Registered Nurse Anesthetist

## 2017-09-22 ENCOUNTER — Inpatient Hospital Stay (HOSPITAL_COMMUNITY)
Admission: RE | Admit: 2017-09-22 | Discharge: 2017-09-23 | DRG: 470 | Disposition: A | Discharge status: Home / Self Care | Payer: 59 | Source: Ambulatory Visit | Attending: Orthopaedic Surgery | Admitting: Orthopaedic Surgery

## 2017-09-22 ENCOUNTER — Encounter (HOSPITAL_COMMUNITY)
Admission: RE | Disposition: A | Discharge status: Home / Self Care | Payer: Self-pay | Source: Ambulatory Visit | Attending: Orthopaedic Surgery

## 2017-09-22 DIAGNOSIS — Z6839 Body mass index (BMI) 39.0-39.9, adult: Secondary | ICD-10-CM | POA: Diagnosis not present

## 2017-09-22 DIAGNOSIS — M1611 Unilateral primary osteoarthritis, right hip: Secondary | ICD-10-CM

## 2017-09-22 DIAGNOSIS — F329 Major depressive disorder, single episode, unspecified: Secondary | ICD-10-CM | POA: Diagnosis present

## 2017-09-22 DIAGNOSIS — F17210 Nicotine dependence, cigarettes, uncomplicated: Secondary | ICD-10-CM | POA: Diagnosis present

## 2017-09-22 DIAGNOSIS — Z96641 Presence of right artificial hip joint: Secondary | ICD-10-CM

## 2017-09-22 DIAGNOSIS — Z7982 Long term (current) use of aspirin: Secondary | ICD-10-CM | POA: Diagnosis not present

## 2017-09-22 DIAGNOSIS — M1612 Unilateral primary osteoarthritis, left hip: Secondary | ICD-10-CM | POA: Diagnosis not present

## 2017-09-22 DIAGNOSIS — Z791 Long term (current) use of non-steroidal anti-inflammatories (NSAID): Secondary | ICD-10-CM

## 2017-09-22 DIAGNOSIS — Z471 Aftercare following joint replacement surgery: Secondary | ICD-10-CM | POA: Diagnosis not present

## 2017-09-22 DIAGNOSIS — M25551 Pain in right hip: Secondary | ICD-10-CM | POA: Diagnosis not present

## 2017-09-22 DIAGNOSIS — Z419 Encounter for procedure for purposes other than remedying health state, unspecified: Secondary | ICD-10-CM

## 2017-09-22 DIAGNOSIS — E7800 Pure hypercholesterolemia, unspecified: Secondary | ICD-10-CM | POA: Diagnosis not present

## 2017-09-22 DIAGNOSIS — Z888 Allergy status to other drugs, medicaments and biological substances status: Secondary | ICD-10-CM

## 2017-09-22 HISTORY — DX: Presence of right artificial hip joint: Z96.641

## 2017-09-22 HISTORY — PX: TOTAL HIP ARTHROPLASTY: SHX124

## 2017-09-22 LAB — TYPE AND SCREEN
ABO/RH(D): O NEG
Antibody Screen: NEGATIVE

## 2017-09-22 SURGERY — ARTHROPLASTY, HIP, TOTAL, ANTERIOR APPROACH
Anesthesia: General | Site: Hip | Laterality: Right

## 2017-09-22 MED ORDER — ROCURONIUM BROMIDE 50 MG/5ML IV SOSY
PREFILLED_SYRINGE | INTRAVENOUS | Status: DC | PRN
  Administered 2017-09-22 (×2): 10 mg via INTRAVENOUS
  Administered 2017-09-22: 40 mg via INTRAVENOUS

## 2017-09-22 MED ORDER — ACETAMINOPHEN 10 MG/ML IV SOLN
1000.0000 mg | Freq: Once | INTRAVENOUS | Status: DC | PRN
  Administered 2017-09-22: 1000 mg via INTRAVENOUS

## 2017-09-22 MED ORDER — ALPRAZOLAM 0.25 MG PO TABS
0.2500 mg | ORAL_TABLET | Freq: Three times a day (TID) | ORAL | Status: DC | PRN
  Administered 2017-09-22: 0.25 mg via ORAL
  Filled 2017-09-22 (×2): qty 1

## 2017-09-22 MED ORDER — SUCCINYLCHOLINE CHLORIDE 200 MG/10ML IV SOSY
PREFILLED_SYRINGE | INTRAVENOUS | Status: DC | PRN
  Administered 2017-09-22: 100 mg via INTRAVENOUS

## 2017-09-22 MED ORDER — DIPHENHYDRAMINE HCL 12.5 MG/5ML PO ELIX: 12.5000 mg | ORAL_SOLUTION | ORAL | Status: DC | PRN

## 2017-09-22 MED ORDER — DEXAMETHASONE SODIUM PHOSPHATE 10 MG/ML IJ SOLN
INTRAMUSCULAR | Status: DC | PRN
  Administered 2017-09-22: 10 mg via INTRAVENOUS

## 2017-09-22 MED ORDER — OXYCODONE HCL 5 MG PO TABS
10.0000 mg | ORAL_TABLET | ORAL | Status: DC | PRN
  Administered 2017-09-22: 10 mg via ORAL
  Administered 2017-09-22 (×3): 15 mg via ORAL
  Administered 2017-09-23 (×2): 10 mg via ORAL
  Filled 2017-09-22 (×5): qty 3

## 2017-09-22 MED ORDER — HYDROMORPHONE HCL 1 MG/ML IJ SOLN
0.5000 mg | INTRAMUSCULAR | Status: DC | PRN
  Administered 2017-09-22 (×2): 1 mg via INTRAVENOUS
  Filled 2017-09-22 (×2): qty 1

## 2017-09-22 MED ORDER — KETOROLAC TROMETHAMINE 30 MG/ML IJ SOLN
30.0000 mg | Freq: Once | INTRAMUSCULAR | Status: AC
  Administered 2017-09-22: 30 mg via INTRAVENOUS
  Filled 2017-09-22: qty 1

## 2017-09-22 MED ORDER — ONDANSETRON HCL 4 MG PO TABS: 4.0000 mg | ORAL_TABLET | Freq: Four times a day (QID) | ORAL | Status: DC | PRN

## 2017-09-22 MED ORDER — ALBUTEROL SULFATE HFA 108 (90 BASE) MCG/ACT IN AERS
INHALATION_SPRAY | RESPIRATORY_TRACT | Status: DC | PRN
  Administered 2017-09-22: 2 via RESPIRATORY_TRACT

## 2017-09-22 MED ORDER — MENTHOL 3 MG MT LOZG: 1.0000 | LOZENGE | OROMUCOSAL | Status: DC | PRN

## 2017-09-22 MED ORDER — PROPOFOL 10 MG/ML IV BOLUS
INTRAVENOUS | Status: DC | PRN
  Administered 2017-09-22: 160 mg via INTRAVENOUS

## 2017-09-22 MED ORDER — LIDOCAINE 2% (20 MG/ML) 5 ML SYRINGE
INTRAMUSCULAR | Status: DC | PRN
  Administered 2017-09-22: 100 mg via INTRAVENOUS

## 2017-09-22 MED ORDER — HYDROMORPHONE HCL 1 MG/ML IJ SOLN
0.5000 mg | INTRAMUSCULAR | Status: DC | PRN
  Administered 2017-09-22: 1 mg via INTRAVENOUS
  Filled 2017-09-22: qty 1

## 2017-09-22 MED ORDER — ONDANSETRON HCL 4 MG/2ML IJ SOLN
INTRAMUSCULAR | Status: AC
  Filled 2017-09-22: qty 2

## 2017-09-22 MED ORDER — PHENOL 1.4 % MT LIQD: 1.0000 | OROMUCOSAL | Status: DC | PRN

## 2017-09-22 MED ORDER — HYDROCODONE-ACETAMINOPHEN 7.5-325 MG PO TABS: 1.0000 | ORAL_TABLET | Freq: Once | ORAL | Status: DC | PRN

## 2017-09-22 MED ORDER — METHOCARBAMOL 500 MG PO TABS: 500.0000 mg | ORAL_TABLET | Freq: Four times a day (QID) | ORAL | 0 refills | Status: DC | PRN

## 2017-09-22 MED ORDER — SUGAMMADEX SODIUM 200 MG/2ML IV SOLN
INTRAVENOUS | Status: DC | PRN
  Administered 2017-09-22: 200 mg via INTRAVENOUS

## 2017-09-22 MED ORDER — MEPERIDINE HCL 50 MG/ML IJ SOLN: 6.2500 mg | INTRAMUSCULAR | Status: DC | PRN

## 2017-09-22 MED ORDER — DEXMEDETOMIDINE HCL 200 MCG/2ML IV SOLN
INTRAVENOUS | Status: DC | PRN
  Administered 2017-09-22 (×2): 12 ug via INTRAVENOUS
  Administered 2017-09-22: 8 ug via INTRAVENOUS
  Administered 2017-09-22: 12 ug via INTRAVENOUS
  Administered 2017-09-22: 8 ug via INTRAVENOUS
  Administered 2017-09-22: 4 ug via INTRAVENOUS
  Administered 2017-09-22: 8 ug via INTRAVENOUS
  Administered 2017-09-22 (×3): 12 ug via INTRAVENOUS

## 2017-09-22 MED ORDER — HYDROMORPHONE HCL 1 MG/ML IJ SOLN
INTRAMUSCULAR | Status: AC
  Filled 2017-09-22: qty 1

## 2017-09-22 MED ORDER — OXYCODONE HCL 5 MG PO TABS: 5.0000 mg | ORAL_TABLET | ORAL | 0 refills | Status: DC | PRN

## 2017-09-22 MED ORDER — PROMETHAZINE HCL 25 MG/ML IJ SOLN: 6.2500 mg | INTRAMUSCULAR | Status: DC | PRN

## 2017-09-22 MED ORDER — EPHEDRINE 5 MG/ML INJ
INTRAVENOUS | Status: AC
  Filled 2017-09-22: qty 10

## 2017-09-22 MED ORDER — DOCUSATE SODIUM 100 MG PO CAPS
100.0000 mg | ORAL_CAPSULE | Freq: Two times a day (BID) | ORAL | Status: DC
  Administered 2017-09-22 – 2017-09-23 (×2): 100 mg via ORAL
  Filled 2017-09-22 (×2): qty 1

## 2017-09-22 MED ORDER — MIDAZOLAM HCL 2 MG/2ML IJ SOLN
INTRAMUSCULAR | Status: AC
  Filled 2017-09-22: qty 2

## 2017-09-22 MED ORDER — METHOCARBAMOL 500 MG PO TABS
500.0000 mg | ORAL_TABLET | Freq: Four times a day (QID) | ORAL | Status: DC | PRN
  Administered 2017-09-22 – 2017-09-23 (×4): 500 mg via ORAL
  Filled 2017-09-22 (×4): qty 1

## 2017-09-22 MED ORDER — FENTANYL CITRATE (PF) 100 MCG/2ML IJ SOLN
INTRAMUSCULAR | Status: DC | PRN
  Administered 2017-09-22 (×4): 50 ug via INTRAVENOUS
  Administered 2017-09-22: 100 ug via INTRAVENOUS

## 2017-09-22 MED ORDER — FENTANYL CITRATE (PF) 250 MCG/5ML IJ SOLN
INTRAMUSCULAR | Status: AC
  Filled 2017-09-22: qty 5

## 2017-09-22 MED ORDER — ALUM & MAG HYDROXIDE-SIMETH 200-200-20 MG/5ML PO SUSP: 30.0000 mL | ORAL | Status: DC | PRN

## 2017-09-22 MED ORDER — OXYCODONE HCL 5 MG PO TABS
5.0000 mg | ORAL_TABLET | ORAL | Status: DC | PRN
  Administered 2017-09-23: 10 mg via ORAL
  Filled 2017-09-22 (×2): qty 2

## 2017-09-22 MED ORDER — ASPIRIN 81 MG PO CHEW
81.0000 mg | CHEWABLE_TABLET | Freq: Two times a day (BID) | ORAL | Status: DC
  Administered 2017-09-22 – 2017-09-23 (×2): 81 mg via ORAL
  Filled 2017-09-22 (×2): qty 1

## 2017-09-22 MED ORDER — ZOLPIDEM TARTRATE 5 MG PO TABS: 5.0000 mg | ORAL_TABLET | Freq: Every evening | ORAL | Status: DC | PRN

## 2017-09-22 MED ORDER — CITALOPRAM HYDROBROMIDE 20 MG PO TABS
10.0000 mg | ORAL_TABLET | Freq: Every day | ORAL | Status: DC
  Administered 2017-09-22 – 2017-09-23 (×2): 10 mg via ORAL
  Filled 2017-09-22 (×2): qty 1

## 2017-09-22 MED ORDER — ONDANSETRON HCL 4 MG/2ML IJ SOLN: 4.0000 mg | Freq: Four times a day (QID) | INTRAMUSCULAR | Status: DC | PRN

## 2017-09-22 MED ORDER — CHLORHEXIDINE GLUCONATE 4 % EX LIQD: 60.0000 mL | Freq: Once | CUTANEOUS | Status: DC

## 2017-09-22 MED ORDER — METOCLOPRAMIDE HCL 5 MG/ML IJ SOLN: 5.0000 mg | Freq: Three times a day (TID) | INTRAMUSCULAR | Status: DC | PRN

## 2017-09-22 MED ORDER — FENTANYL CITRATE (PF) 100 MCG/2ML IJ SOLN
INTRAMUSCULAR | Status: AC
  Filled 2017-09-22: qty 2

## 2017-09-22 MED ORDER — POLYETHYLENE GLYCOL 3350 17 G PO PACK: 17.0000 g | PACK | Freq: Every day | ORAL | Status: DC | PRN

## 2017-09-22 MED ORDER — MIDAZOLAM HCL 5 MG/5ML IJ SOLN
INTRAMUSCULAR | Status: DC | PRN
  Administered 2017-09-22: 2 mg via INTRAVENOUS

## 2017-09-22 MED ORDER — SODIUM CHLORIDE 0.9 % IV SOLN
INTRAVENOUS | Status: DC
  Administered 2017-09-22 – 2017-09-23 (×2): via INTRAVENOUS

## 2017-09-22 MED ORDER — LACTATED RINGERS IV SOLN
INTRAVENOUS | Status: DC
  Administered 2017-09-22 (×2): via INTRAVENOUS

## 2017-09-22 MED ORDER — ASPIRIN 81 MG PO CHEW: 81.0000 mg | CHEWABLE_TABLET | Freq: Two times a day (BID) | ORAL | 0 refills | Status: DC

## 2017-09-22 MED ORDER — SUGAMMADEX SODIUM 200 MG/2ML IV SOLN
INTRAVENOUS | Status: AC
  Filled 2017-09-22: qty 2

## 2017-09-22 MED ORDER — DEXMEDETOMIDINE HCL IN NACL 200 MCG/50ML IV SOLN
INTRAVENOUS | Status: AC
  Filled 2017-09-22: qty 50

## 2017-09-22 MED ORDER — METOCLOPRAMIDE HCL 5 MG PO TABS: 5.0000 mg | ORAL_TABLET | Freq: Three times a day (TID) | ORAL | Status: DC | PRN

## 2017-09-22 MED ORDER — HYDROMORPHONE HCL 1 MG/ML IJ SOLN
0.2500 mg | INTRAMUSCULAR | Status: DC | PRN
  Administered 2017-09-22 (×4): 0.5 mg via INTRAVENOUS

## 2017-09-22 MED ORDER — SODIUM CHLORIDE 0.9 % IR SOLN
Status: DC | PRN
  Administered 2017-09-22: 1000 mL

## 2017-09-22 MED ORDER — 0.9 % SODIUM CHLORIDE (POUR BTL) OPTIME
TOPICAL | Status: DC | PRN
  Administered 2017-09-22: 1000 mL

## 2017-09-22 MED ORDER — METHOCARBAMOL 1000 MG/10ML IJ SOLN
500.0000 mg | Freq: Four times a day (QID) | INTRAVENOUS | Status: DC | PRN
  Administered 2017-09-22: 500 mg via INTRAVENOUS
  Filled 2017-09-22: qty 550

## 2017-09-22 MED ORDER — EPHEDRINE SULFATE 50 MG/ML IJ SOLN
INTRAMUSCULAR | Status: DC | PRN
  Administered 2017-09-22: 10 mg via INTRAVENOUS

## 2017-09-22 MED ORDER — CEFAZOLIN SODIUM-DEXTROSE 1-4 GM/50ML-% IV SOLN
1.0000 g | Freq: Four times a day (QID) | INTRAVENOUS | Status: AC
  Administered 2017-09-22 (×2): 1 g via INTRAVENOUS
  Filled 2017-09-22 (×2): qty 50

## 2017-09-22 MED ORDER — ACETAMINOPHEN 10 MG/ML IV SOLN
INTRAVENOUS | Status: AC
  Filled 2017-09-22: qty 100

## 2017-09-22 MED ORDER — ONDANSETRON HCL 4 MG/2ML IJ SOLN
INTRAMUSCULAR | Status: DC | PRN
  Administered 2017-09-22: 4 mg via INTRAVENOUS

## 2017-09-22 MED ORDER — PROPOFOL 10 MG/ML IV BOLUS
INTRAVENOUS | Status: AC
  Filled 2017-09-22: qty 40

## 2017-09-22 MED ORDER — DEXAMETHASONE SODIUM PHOSPHATE 10 MG/ML IJ SOLN
INTRAMUSCULAR | Status: AC
  Filled 2017-09-22: qty 1

## 2017-09-22 MED ORDER — CEFAZOLIN SODIUM-DEXTROSE 2-4 GM/100ML-% IV SOLN
2.0000 g | INTRAVENOUS | Status: AC
  Administered 2017-09-22: 2 g via INTRAVENOUS
  Filled 2017-09-22: qty 100

## 2017-09-22 MED ORDER — ACETAMINOPHEN 325 MG PO TABS: 325.0000 mg | ORAL_TABLET | Freq: Four times a day (QID) | ORAL | Status: DC | PRN

## 2017-09-22 MED ORDER — STERILE WATER FOR IRRIGATION IR SOLN
Status: DC | PRN
  Administered 2017-09-22: 2000 mL

## 2017-09-22 MED ORDER — GABAPENTIN 100 MG PO CAPS
100.0000 mg | ORAL_CAPSULE | Freq: Three times a day (TID) | ORAL | Status: DC
  Administered 2017-09-22 – 2017-09-23 (×3): 100 mg via ORAL
  Filled 2017-09-22 (×3): qty 1

## 2017-09-22 SURGICAL SUPPLY — 40 items
BAG ZIPLOCK 12X15 (MISCELLANEOUS) ×2 IMPLANT
BENZOIN TINCTURE PRP APPL 2/3 (GAUZE/BANDAGES/DRESSINGS) ×4 IMPLANT
BLADE SAW SGTL 18X1.27X75 (BLADE) ×2 IMPLANT
CAPT HIP TOTAL 2 ×2 IMPLANT
COVER PERINEAL POST (MISCELLANEOUS) ×2 IMPLANT
COVER SURGICAL LIGHT HANDLE (MISCELLANEOUS) ×2 IMPLANT
DRAPE STERI IOBAN 125X83 (DRAPES) ×2 IMPLANT
DRAPE U-SHAPE 47X51 STRL (DRAPES) ×4 IMPLANT
DRESSING AQUACEL AG SP 3.5X10 (GAUZE/BANDAGES/DRESSINGS) ×1 IMPLANT
DRSG AQUACEL AG SP 3.5X10 (GAUZE/BANDAGES/DRESSINGS) ×2
DURAPREP 26ML APPLICATOR (WOUND CARE) ×2 IMPLANT
ELECT REM PT RETURN 15FT ADLT (MISCELLANEOUS) ×2 IMPLANT
GLOVE BIO SURGEON STRL SZ7.5 (GLOVE) ×2 IMPLANT
GLOVE BIOGEL PI IND STRL 6.5 (GLOVE) ×1 IMPLANT
GLOVE BIOGEL PI IND STRL 7.5 (GLOVE) ×3 IMPLANT
GLOVE BIOGEL PI IND STRL 8 (GLOVE) ×2 IMPLANT
GLOVE BIOGEL PI IND STRL 8.5 (GLOVE) ×2 IMPLANT
GLOVE BIOGEL PI INDICATOR 6.5 (GLOVE) ×1
GLOVE BIOGEL PI INDICATOR 7.5 (GLOVE) ×3
GLOVE BIOGEL PI INDICATOR 8 (GLOVE) ×2
GLOVE BIOGEL PI INDICATOR 8.5 (GLOVE) ×2
GLOVE ECLIPSE 7.5 STRL STRAW (GLOVE) ×4 IMPLANT
GLOVE ECLIPSE 8.0 STRL XLNG CF (GLOVE) ×2 IMPLANT
GLOVE SURG SS PI 6.5 STRL IVOR (GLOVE) ×2 IMPLANT
GOWN STRL REUS W/ TWL XL LVL3 (GOWN DISPOSABLE) ×1 IMPLANT
GOWN STRL REUS W/TWL LRG LVL3 (GOWN DISPOSABLE) ×2 IMPLANT
GOWN STRL REUS W/TWL XL LVL3 (GOWN DISPOSABLE) ×7 IMPLANT
HANDPIECE INTERPULSE COAX TIP (DISPOSABLE) ×1
HOLDER FOLEY CATH W/STRAP (MISCELLANEOUS) ×2 IMPLANT
PACK ANTERIOR HIP CUSTOM (KITS) ×2 IMPLANT
SET HNDPC FAN SPRY TIP SCT (DISPOSABLE) ×1 IMPLANT
STAPLER VISISTAT 35W (STAPLE) IMPLANT
STRIP CLOSURE SKIN 1/2X4 (GAUZE/BANDAGES/DRESSINGS) ×2 IMPLANT
SUT ETHIBOND NAB CT1 #1 30IN (SUTURE) ×2 IMPLANT
SUT MNCRL AB 4-0 PS2 18 (SUTURE) IMPLANT
SUT VIC AB 0 CT1 36 (SUTURE) ×2 IMPLANT
SUT VIC AB 1 CT1 36 (SUTURE) ×2 IMPLANT
SUT VIC AB 2-0 CT1 27 (SUTURE) ×2
SUT VIC AB 2-0 CT1 TAPERPNT 27 (SUTURE) ×2 IMPLANT
YANKAUER SUCT BULB TIP 10FT TU (MISCELLANEOUS) ×2 IMPLANT
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
## 2017-09-22 NOTE — Anesthesia Procedure Notes (Addendum)
Procedure Name: Intubation Date/Time: 09/22/2017 7:24 AM Performed by: West Pugh, CRNA Pre-anesthesia Checklist: Patient identified, Emergency Drugs available, Suction available, Patient being monitored and Timeout performed Patient Re-evaluated:Patient Re-evaluated prior to induction Oxygen Delivery Method: Circle system utilized Preoxygenation: Pre-oxygenation with 100% oxygen Induction Type: IV induction Ventilation: Mask ventilation without difficulty Laryngoscope Size: Mac and 4 Grade View: Grade I Tube type: Oral Tube size: 7.5 mm Number of attempts: 1 Airway Equipment and Method: Stylet Placement Confirmation: ETT inserted through vocal cords under direct vision,  positive ETCO2,  CO2 detector and breath sounds checked- equal and bilateral Secured at: 21 cm Tube secured with: Tape Dental Injury: Teeth and Oropharynx as per pre-operative assessment  Comments: Head,neck and spine maintained in neutral alignment, AOI.

## 2017-09-22 NOTE — Op Note (Signed)
NAMEDAISHA, FILOSA MEDICAL RECORD [id number] ACCOUNT [id number] DATE OF BIRTH:04/16/1966 FACILITY: [facility] LOCATION: [facility] PHYSICIAN:Homer Pfeifer [place], MD  OPERATIVE REPORT  DATE OF PROCEDURE:  09/22/2017  PREOPERATIVE DIAGNOSES: 1.  Primary osteoarthritis. 2.  Degenerative joint disease, right hip.    POSTOPERATIVE DIAGNOSES: 1.  Primary osteoarthritis. 2.  Degenerative joint disease, right hip.     PROCEDURE:  Right total hip arthroplasty through direct anterior approach.  IMPLANTS:  DePuy Sector Gription acetabular component size 52, size 36+0 neutral polyethylene liner, size 12 Corail femoral component with standard offset, size 36+1 ceramic hip ball.  SURGEON: Lind Guest. Ninfa Linden, MD  ASSISTANT:  Benita Stabile, PA-C  ANESTHESIA:  General.  ANTIBIOTICS:  2 g IV Ancef.  ESTIMATED BLOOD LOSS:  250 mL.  COMPLICATIONS:  None.  INDICATIONS:  The patient is only a 52 year old female with debilitating arthritis involving the right hip.  I have seen her for a long period of time now, and the pain has gotten significantly worse.  Although her x-rays do not show severe arthritis in  her hip, other studies have.  She has even had an intra-articular injection.  It has gotten to where her pain is detrimentally affecting her activities of daily living, her quality of life and her mobility, and she did wish to proceed with a total hip  arthroplasty.  She understands fully the risk of acute blood loss anemia, nerve or vessel injury, fracture, infection, dislocation, DVT.  She understands our goals are to decrease pain, improve mobility, and overall improve quality of life.  DESCRIPTION OF PROCEDURE:  After informed consent was obtained, appropriate right hip was marked.  She was brought to the operating room where general anesthesia was obtained while she was on her stretcher.  I then felt her leg length in a supine  position.  She felt equal.  I put traction boots on both her  feet.  Next, we placed her supine on the Hana fracture table with a perineal post in place and both legs then aligned in a skeletal traction device with no traction applied.  Her right  operative hip was prepped and draped with DuraPrep and sterile drapes.  A time-out was called.  She was identified as correct patient and correct right hip.  We then made an incision just inferior and posterior to the anterior superior iliac spine and  carried this obliquely down the leg.  We dissected down the tensor fascia lata muscle, and then tensor fascia was divided longitudinally to proceed with direct anterior approach to the hip.  We identified and cauterized the circumflex vessels and then  identified the hip capsule ____ the hip capsule in a ____ format, finding moderate joint effusion and significant periarticular osteophytes around the hip itself.  I placed a Cobra retractor on the medial and lateral femoral neck, and with the  oscillating saw, made our femoral neck cut proximal to the lesser trochanter and completed this with an osteotome.  We placed a corkscrew guide in the femoral head and removed the femoral head in its entirety and found a significant area of cartilage  wear at the superolateral aspect.  We then placed a bent Hohmann over the medial acetabular rim and removed remnants of the acetabular labrum and other debris.  We then began reaming under direct visualization from a size 44 reamer and going up in  stepwise increments all the way to a size 51 with all reamers under direct visualization, the last reamer under direct fluoroscopy, so  I could obtain my depth in reaming by inclination and anteversion.  Once I was pleased with this, I placed the real  DePuy Sector Gription acetabular component size 52 and a 36+0 polyethylene liner for that size acetabular component.  Attention was then turned to the femur.  With the leg externally rotated to 120 degrees, extended and adducted, we were able to place  a  Mueller retractor medially and a Hohmann retractor around the greater trochanter.  We released the lateral joint capsule and used a box-cutting osteotome to enter the femoral canal and a rongeur to lateralize and then began broaching from a size 8 broach  using Corail broaching system going up to a size 12.  With a size 12 in place, we trialed a standard offset femoral neck and 36+1.5 hip ball.  We brought the leg back over in upward traction, internal rotation, reducing the pelvis.  We were pleased with  the leg length, offset, range of motion and stability.  We then dislocated the hip and removed the trial components.  We then placed the real Corail femoral component size 12 with standard offset and the real 36+1.5 ceramic hip ball and again reduced  the acetabulum.  We were pleased with stability.  We then irrigated the soft tissues with normal saline solution using pulsatile lavage.  We closed the joint capsule with interrupted #1 Ethibond suture followed by running #1 Vicryl in the tensor fascia,  0 Vicryl in the deep tissue, 2-0 Vicryl subcutaneous tissue, 4-0 Monocryl subcuticular stitch, and Steri-Strips on the skin.  An Aquacel dressing was applied.  She was taken off the Hana table, awakened, extubated, and taken to recovery room in stable  condition.  All final counts were correct.  There were no complications noted.  Of note, Benita Stabile, PA-C, assisted with the entire case.  His assistance was crucial for facilitating all aspects of this case.  LN/NUANCE  D:09/22/2017 T:09/22/2017 JOB:[id number]

## 2017-09-22 NOTE — Brief Op Note (Signed)
09/22/2017  8:40 AM  PATIENT:  [name]  52 y.o. female  PRE-OPERATIVE DIAGNOSIS:  osteoarthritis right hip  POST-OPERATIVE DIAGNOSIS:  osteoarthritis right hip  PROCEDURE:  Procedure(s): RIGHT TOTAL HIP ARTHROPLASTY ANTERIOR APPROACH (Right)  SURGEON:  Surgeon(s) and Role:    Mcarthur Rossetti, MD - Primary  PHYSICIAN ASSISTANT: Benita Stabile, PA-C  ANESTHESIA:   general  EBL:  250 mL   COUNTS:  YES  DICTATION: .Other Dictation: Dictation Number [id number]  PLAN OF CARE: Admit to inpatient   PATIENT DISPOSITION:  PACU - hemodynamically stable.   Delay start of Pharmacological VTE agent (>24hrs) due to surgical blood loss or risk of bleeding: no

## 2017-09-22 NOTE — Discharge Instructions (Signed)

## 2017-09-22 NOTE — Transfer of Care (Signed)
Immediate Anesthesia Transfer of Care Note  [name]  Procedure(s) Performed: RIGHT TOTAL HIP ARTHROPLASTY ANTERIOR APPROACH (Right Hip)  Patient Location: PACU  Anesthesia Type:General  Level of Consciousness: drowsy and patient cooperative  Airway & Oxygen Therapy: Patient Spontanous Breathing and Patient connected to face mask oxygen  Post-op Assessment: Report given to RN and Post -op Vital signs reviewed and stable  Post vital signs: Reviewed and stable  Last Vitals:  Vitals Value Taken Time  BP 117/49 09/22/2017  9:11 AM  Temp    Pulse 69 09/22/2017  9:15 AM  Resp 21 09/22/2017  9:15 AM  SpO2 96 % 09/22/2017  9:15 AM  Vitals shown include unvalidated device data.  Last Pain:  Vitals:   09/22/17 0552  TempSrc: Oral  PainSc: 6       Patients Stated Pain Goal: 5 (26/83/41 9622)  Complications: No apparent anesthesia complications

## 2017-09-22 NOTE — Anesthesia Postprocedure Evaluation (Signed)
Anesthesia Post Note  [name]  Procedure(s) Performed: RIGHT TOTAL HIP ARTHROPLASTY ANTERIOR APPROACH (Right Hip)     Patient location during evaluation: PACU Anesthesia Type: General Level of consciousness: awake and alert Pain management: pain level controlled Vital Signs Assessment: post-procedure vital signs reviewed and stable Respiratory status: spontaneous breathing, nonlabored ventilation, respiratory function stable and patient connected to nasal cannula oxygen Cardiovascular status: blood pressure returned to baseline and stable Postop Assessment: no apparent nausea or vomiting Anesthetic complications: no    Last Vitals:  Vitals:   09/22/17 1209 09/22/17 1305  BP: 122/64 116/69  Pulse: 75 71  Resp: 18 18  Temp: 36.6 C 36.4 C  SpO2: 96% 100%    Last Pain:  Vitals:   09/22/17 1305  TempSrc: Oral  PainSc:                  Barnet Glasgow

## 2017-09-22 NOTE — Evaluation (Signed)
Physical Therapy Evaluation Patient Details Name: Heather Orr MRN: [id number] DOB: 1965-10-27 Today's Date: 09/22/2017   History of Present Illness  Pt s/p R THR and with hx of DDD  Clinical Impression  Pt s/p R THR and presents with decreased R LE strength/ROM and post op pain limiting functional mobility.  Pt should progress to dc home with family assist.    Follow Up Recommendations Follow surgeon's recommendation for DC plan and follow-up therapies    Equipment Recommendations  None recommended by PT    Recommendations for Other Services OT consult     Precautions / Restrictions Precautions Precautions: Fall Restrictions Weight Bearing Restrictions: No Other Position/Activity Restrictions: WBAT      Mobility  Bed Mobility Overal bed mobility: Needs Assistance Bed Mobility: Supine to Sit     Supine to sit: Min assist     General bed mobility comments: cues for sequence and use of L LE to self assist  Transfers Overall transfer level: Needs assistance Equipment used: Rolling walker (2 wheeled) Transfers: Sit to/from [facility] Sit to Stand: Min assist Stand pivot transfers: Min assist       General transfer comment: cues for LE management and use of UEs to self assist  Ambulation/Gait Ambulation/Gait assistance: Min assist Ambulation Distance (Feet): 6 Feet Assistive device: Rolling walker (2 wheeled) Gait Pattern/deviations: Step-to pattern;Decreased step length - right;Decreased step length - left;Shuffle;Trunk flexed Gait velocity: decr   General Gait Details: cues for sequence, posture and position from RW.  Distance ltd by pain  Stairs            Wheelchair Mobility    Modified Rankin (Stroke Patients Only)       Balance                                             Pertinent Vitals/Pain Pain Assessment: 0-10 Pain Score: 8  Pain Location: R hip Pain Descriptors / Indicators:  Aching;Sore;Throbbing Pain Intervention(s): Limited activity within patient's tolerance;Monitored during session;Premedicated before session;Patient requesting pain meds-RN notified;Ice applied    Home Living Family/patient expects to be discharged to:: Private residence Living Arrangements: Spouse/significant other Available Help at Discharge: Family Type of Home: House Home Access: Stairs to enter Entrance Stairs-Rails: Right Entrance Stairs-Number of Steps: 5 Home Layout: One level Home Equipment: Environmental consultant - 2 wheels;Cane - single point      Prior Function Level of Independence: Independent               Hand Dominance        Extremity/Trunk Assessment   Upper Extremity Assessment Upper Extremity Assessment: Overall WFL for tasks assessed    Lower Extremity Assessment Lower Extremity Assessment: RLE deficits/detail       Communication   Communication: No difficulties  Cognition Arousal/Alertness: Awake/alert Behavior During Therapy: WFL for tasks assessed/performed Overall Cognitive Status: Within Functional Limits for tasks assessed                                        General Comments      Exercises     Assessment/Plan    PT Assessment Patient needs continued PT services  PT Problem List Decreased strength;Decreased range of motion;Decreased activity tolerance;Decreased mobility;Pain;Obesity;Decreased knowledge of use of DME  PT Treatment Interventions DME instruction;Gait training;Stair training;Functional mobility training;Therapeutic activities;Therapeutic exercise;Patient/family education    PT Goals (Current goals can be found in the Care Plan section)  Acute Rehab PT Goals Patient Stated Goal: Go to niece's birthday party tomorrow at 2pm PT Goal Formulation: With patient Time For Goal Achievement: 09/29/17 Potential to Achieve Goals: Good    Frequency 7X/week   Barriers to discharge        Co-evaluation                AM-PAC PT "6 Clicks" Daily Activity  Outcome Measure Difficulty turning over in bed (including adjusting bedclothes, sheets and blankets)?: Unable Difficulty moving from lying on back to sitting on the side of the bed? : Unable Difficulty sitting down on and standing up from a chair with arms (e.g., wheelchair, bedside commode, etc,.)?: Unable Help needed moving to and from a bed to chair (including a wheelchair)?: A Lot Help needed walking in hospital room?: A Lot Help needed climbing 3-5 steps with a railing? : A Lot 6 Click Score: 9    End of Session Equipment Utilized During Treatment: Gait belt Activity Tolerance: Patient limited by pain Patient left: in chair;with call bell/phone within reach;with chair alarm set Nurse Communication: Mobility status PT Visit Diagnosis: Difficulty in walking, not elsewhere classified (R26.2)    Time: 4801-6553 PT Time Calculation (min) (ACUTE ONLY): 23 min   Charges:   PT Evaluation $PT Eval Low Complexity: 1 Low PT Treatments $Therapeutic Activity: 8-22 mins   PT G Codes:        Pg [phone]   Tracey Hermance 09/22/2017, 6:18 PM

## 2017-09-23 LAB — CBC
HCT: 39.2 % (ref 36.0–46.0)
Hemoglobin: 12.6 g/dL (ref 12.0–15.0)
MCH: 30.1 pg (ref 26.0–34.0)
MCHC: 32.1 g/dL (ref 30.0–36.0)
MCV: 93.8 fL (ref 78.0–100.0)
PLATELETS: 334 10*3/uL (ref 150–400)
RBC: 4.18 MIL/uL (ref 3.87–5.11)
RDW: 13.6 % (ref 11.5–15.5)
WBC: 14.1 10*3/uL — AB (ref 4.0–10.5)

## 2017-09-23 LAB — BASIC METABOLIC PANEL
ANION GAP: 8 (ref 5–15)
BUN: 12 mg/dL (ref 6–20)
CALCIUM: 8.6 mg/dL — AB (ref 8.9–10.3)
CO2: 22 mmol/L (ref 22–32)
Chloride: 108 mmol/L (ref 101–111)
Creatinine, Ser: 0.69 mg/dL (ref 0.44–1.00)
GFR calc Af Amer: >60 mL/min (ref >60)
GFR calc non Af Amer: >60 mL/min (ref >60)
Glucose, Bld: 122 mg/dL — ABNORMAL HIGH (ref 65–99)
POTASSIUM: 4 mmol/L (ref 3.5–5.1)
Sodium: 138 mmol/L (ref 135–145)
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
## 2017-09-23 NOTE — Progress Notes (Signed)
Physical Therapy Treatment Patient Details Name: Heather Orr MRN: [id number] DOB: May 02, 1966 Today's Date: 09/23/2017    History of Present Illness Pt s/p R THR and with hx of DDD    PT Comments    Pt motivated but continues pain limited.  Reviewed stairs, car transfers and home therex with progression and written instruction.     Follow Up Recommendations  Follow surgeon's recommendation for DC plan and follow-up therapies     Equipment Recommendations  None recommended by PT    Recommendations for Other Services OT consult     Precautions / Restrictions Precautions Precautions: Fall Restrictions Weight Bearing Restrictions: No Other Position/Activity Restrictions: WBAT    Mobility  Bed Mobility               General bed mobility comments: Pt up in chair and requests return to same  Transfers Overall transfer level: Needs assistance Equipment used: Rolling walker (2 wheeled) Transfers: Sit to/from Stand Sit to Stand: Supervision Stand pivot transfers: Supervision       General transfer comment: cues for UE/LE placement  Ambulation/Gait Ambulation/Gait assistance: Min guard;Supervision Ambulation Distance (Feet): 150 Feet Assistive device: Rolling walker (2 wheeled) Gait Pattern/deviations: Step-to pattern;Decreased step length - right;Decreased step length - left;Shuffle;Trunk flexed Gait velocity: decr   General Gait Details: min cues for posture and position from RW.    Stairs Stairs: Yes Stairs assistance: Min assist Stair Management: One rail Right;Step to pattern;Forwards;With cane Number of Stairs: 5 General stair comments: cues for seqence and foot/cane placement   Wheelchair Mobility    Modified Rankin (Stroke Patients Only)       Balance                                            Cognition Arousal/Alertness: Awake/alert Behavior During Therapy: WFL for tasks assessed/performed Overall Cognitive Status:  Within Functional Limits for tasks assessed                                        Exercises Total Joint Exercises Ankle Circles/Pumps: AROM;Both;20 reps;Supine Quad Sets: AROM;Both;10 reps;Supine Heel Slides: AAROM;Right;15 reps;Supine Hip ABduction/ADduction: AAROM;Right;10 reps;Supine    General Comments        Pertinent Vitals/Pain Pain Assessment: 0-10 Pain Score: 6  Pain Location: R hip Pain Descriptors / Indicators: Aching;Sore;Throbbing Pain Intervention(s): Limited activity within patient's tolerance;Monitored during session;Premedicated before session;Ice applied    Home Living                      Prior Function            PT Goals (current goals can now be found in the care plan section) Acute Rehab PT Goals Patient Stated Goal: Go to niece's birthday party today at 2pm PT Goal Formulation: With patient Time For Goal Achievement: 09/29/17 Potential to Achieve Goals: Good Progress towards PT goals: Progressing toward goals    Frequency    7X/week      PT Plan Current plan remains appropriate    Co-evaluation              AM-PAC PT "6 Clicks" Daily Activity  Outcome Measure  Difficulty turning over in bed (including adjusting bedclothes, sheets and blankets)?: A Lot Difficulty moving from lying on  back to sitting on the side of the bed? : A Lot Difficulty sitting down on and standing up from a chair with arms (e.g., wheelchair, bedside commode, etc,.)?: A Lot Help needed moving to and from a bed to chair (including a wheelchair)?: A Little Help needed walking in hospital room?: A Little Help needed climbing 3-5 steps with a railing? : A Little 6 Click Score: 15    End of Session Equipment Utilized During Treatment: Gait belt Activity Tolerance: Patient limited by pain Patient left: in chair;with call bell/phone within reach Nurse Communication: Mobility status PT Visit Diagnosis: Difficulty in walking, not  elsewhere classified (R26.2)     Time: 1100-1145 PT Time Calculation (min) (ACUTE ONLY): 45 min  Charges:  $Gait Training: 8-22 mins $Therapeutic Exercise: 8-22 mins $Therapeutic Activity: 8-22 mins                    G Codes:       Pg [phone]    Alawna Graybeal 09/23/2017, 2:37 PM

## 2017-09-23 NOTE — Progress Notes (Signed)
Subjective: 1 Day Post-Op Procedure(s) (LRB): RIGHT TOTAL HIP ARTHROPLASTY ANTERIOR APPROACH (Right) Patient reports pain as mild.  Doing well this am.  Objective: Vital signs in last 24 hours: Temp:  [97.4 F (36.3 C)-98.7 F (37.1 C)] 98.7 F (37.1 C) (05/04 0550) Pulse Rate:  [67-98] 86 (05/04 0550) Resp:  [11-22] 16 (05/04 0550) BP: (100-138)/(56-86) 114/57 (05/04 0550) SpO2:  [91 %-100 %] 95 % (05/04 0550)  Intake/Output from previous day: 05/03 0701 - 05/04 0700 In: 4094.9 [P.O.:840; I.V.:3099.9; IV Piggyback:155] Out: 1100 [Urine:850; Blood:250] Intake/Output this shift: Total I/O In: 240 [P.O.:240] Out: -   Recent Labs    09/23/17 0518  HGB 12.6   Recent Labs    09/23/17 0518  WBC 14.1*  RBC 4.18  HCT 39.2  PLT 334   Recent Labs    09/23/17 0518  NA 138  K 4.0  CL 108  CO2 22  BUN 12  CREATININE 0.69  GLUCOSE 122*  CALCIUM 8.6*   No results for input(s): LABPT, INR in the last 72 hours.  Neurologically intact Neurovascular intact Sensation intact distally Intact pulses distally Dorsiflexion/Plantar flexion intact Incision: dressing C/D/I No cellulitis present Compartment soft    Assessment/Plan: 1 Day Post-Op Procedure(s) (LRB): RIGHT TOTAL HIP ARTHROPLASTY ANTERIOR APPROACH (Right) Advance diet Up with therapy D/C IV fluids Discharge home with home health after am session of PT as long as patient mobilizes well and feels comfortable leaving.  She would like to be at her niece's birthday party this afternoon at 2pm if possible.   WBAT RLE-anterior hip precautions     Aundra Dubin 09/23/2017, 9:29 AM

## 2017-09-23 NOTE — Care Management Note (Signed)
0930 am Discharge Planning:  NCM spoke to pt and offered choice for [facility]. Pt requesting [facility]. States she will call her friend to see if they can accept referrral. She has RW and 3n1 bedside commode at home.   63 NCM contacted pt via phone. She has declined HH at this time. Contacted [facility] to cancel referral. Pt states she is doing well with ambulating. Explained she can call surgeon's office if she decides on HHPT or OPPT. Office will arrange. Jonnie Finner RN CCM Case Mgmt phone [phone]

## 2017-09-23 NOTE — Evaluation (Signed)
Occupational Therapy Evaluation Patient Details Name: DEMARA LOVER MRN: [id number] DOB: [birth year] Today's Date: 09/23/2017    History of Present Illness Pt s/p R THR and with hx of DDD   Clinical Impression   This 52 year old female was admitted for the above sx. All education was completed. No further OT is needed at this time    Follow Up Recommendations  Supervision - Intermittent    Equipment Recommendations  (pt does not want 3:1)    Recommendations for Other Services       Precautions / Restrictions Precautions Precautions: Fall Restrictions Weight Bearing Restrictions: No Other Position/Activity Restrictions: WBAT      Mobility Bed Mobility         Supine to sit: Min assist     General bed mobility comments: assist for LLE  Transfers       Sit to Stand: Min guard         General transfer comment: cues for UE/LE placement    Balance                                           ADL either performed or assessed with clinical judgement   ADL Overall ADL's : Needs assistance/impaired     Grooming: Oral care;Supervision/safety;Standing   Upper Body Bathing: Set up;Sitting   Lower Body Bathing: Supervison/ safety;Sit to/from stand;With adaptive equipment   Upper Body Dressing : Set up;Sitting   Lower Body Dressing: Min guard;Sit to/from stand;With adaptive equipment   Toilet Transfer: Min guard;RW;Comfort height toilet   Toileting- Water quality scientist and Hygiene: Min guard;Sit to/from stand   Tub/ Shower Transfer: Walk-in shower;Min guard;Ambulation;3 in 1     General ADL Comments: ambulated to bathroom, performed grooming and toilet, shower transfers. Has reacher and sock aide at home.  Practiced with sock aide.  Also educated on toilet aide     Vision         Perception     Praxis      Pertinent Vitals/Pain Pain Score: 5  Pain Location: R hip Pain Descriptors / Indicators: Aching;Sore;Throbbing Pain  Intervention(s): Limited activity within patient's tolerance;Monitored during session;Repositioned(NT to bring ice)     Hand Dominance     Extremity/Trunk Assessment Upper Extremity Assessment Upper Extremity Assessment: Overall WFL for tasks assessed           Communication Communication Communication: No difficulties   Cognition Arousal/Alertness: Awake/alert Behavior During Therapy: WFL for tasks assessed/performed Overall Cognitive Status: Within Functional Limits for tasks assessed                                     General Comments       Exercises     Shoulder Instructions      Home Living Family/patient expects to be discharged to:: Private residence Living Arrangements: Spouse/significant other Available Help at Discharge: Family               Bathroom Shower/Tub: Walk-in Psychologist, prison and probation services: Standard     Home Equipment: Environmental consultant - 2 wheels;Cane - single point;Shower seat - built in          Prior Functioning/Environment Level of Independence: Independent                 OT Problem List:  OT Treatment/Interventions:      OT Goals(Current goals can be found in the care plan section) Acute Rehab OT Goals Patient Stated Goal: Go to niece's birthday party tomorrow at 2pm OT Goal Formulation: All assessment and education complete, DC therapy  OT Frequency:     Barriers to D/C:            Co-evaluation              AM-PAC PT "6 Clicks" Daily Activity     Outcome Measure                 End of Session    Activity Tolerance: Patient tolerated treatment well Patient left: in bed;with call bell/phone within reach  OT Visit Diagnosis: Pain Pain - Right/Left: Right Pain - part of body: Hip                Time: 7371-0626 OT Time Calculation (min): 19 min Charges:  OT General Charges $OT Visit: 1 Visit OT Evaluation $OT Eval Low Complexity: 1 Low G-Codes:     [place]ell,  OTR/L [phone] 09/23/2017  Kesean Serviss 09/23/2017, 10:02 AM
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
## 2017-09-25 ENCOUNTER — Telehealth (INDEPENDENT_AMBULATORY_CARE_PROVIDER_SITE_OTHER): Payer: Self-pay | Admitting: *Deleted

## 2017-09-25 ENCOUNTER — Encounter (HOSPITAL_COMMUNITY): Payer: Self-pay | Admitting: Orthopaedic Surgery

## 2017-09-25 NOTE — Telephone Encounter (Signed)
Keesa from [facility] wanting to let Dr. Ninfa Linden know they have received orders for [facility] services and that they will be out to the home tomorrow May 7.

## 2017-09-26 ENCOUNTER — Telehealth (INDEPENDENT_AMBULATORY_CARE_PROVIDER_SITE_OTHER): Payer: Self-pay

## 2017-09-26 NOTE — Telephone Encounter (Signed)
Beverlee Nims called to inquire if pt did have surgery on 09/22/17. Advised her she did have surgery.

## 2017-10-05 ENCOUNTER — Ambulatory Visit (INDEPENDENT_AMBULATORY_CARE_PROVIDER_SITE_OTHER): Payer: 59 | Admitting: Orthopaedic Surgery

## 2017-10-05 ENCOUNTER — Encounter (INDEPENDENT_AMBULATORY_CARE_PROVIDER_SITE_OTHER): Payer: Self-pay | Admitting: Orthopaedic Surgery

## 2017-10-05 DIAGNOSIS — Z96641 Presence of right artificial hip joint: Secondary | ICD-10-CM

## 2017-10-05 NOTE — Progress Notes (Signed)
Patient is 2 weeks tomorrow status post a right total hip arthroplasty.  She is doing well and has no significant complaints.  She is doing therapy on her own as well ambulate with a cane.  On examination she does have a moderate seroma of her right hip but does not want anything drained off it incision itself looks good except for just a small opening at the top which is very small.  I showed her how want her to put Bactroban ointment on this daily after shower to keep it otherwise clean and dry but she can let soapy water good on her incision.  I did place new Steri-Strips today as well.  Her leg lengths appear equal.  At this point I will see her back in a month see how she is doing overall.  If there is any issues between now in a month she definitely needs to let us know.  She will alternate ice and heat of her hip as well.

## 2017-10-24 IMAGING — CR DG CHEST 2V
1 series · 2 of 2 positions shown · non-contrast
Comparison: None.

CLINICAL DATA: Arrives with C/O chest heaviness and some SOB. Onset
of symptoms today at 0400. C/O heaviness from epigastric area to
chin. Reports similar episode a few days ago, symptoms resolved on
their own.

EXAM:
CHEST  2 VIEW

[Series 1: dg chest 2 view · 0.14mm/px · 2 of 2 slices shown]
[im 1/2]
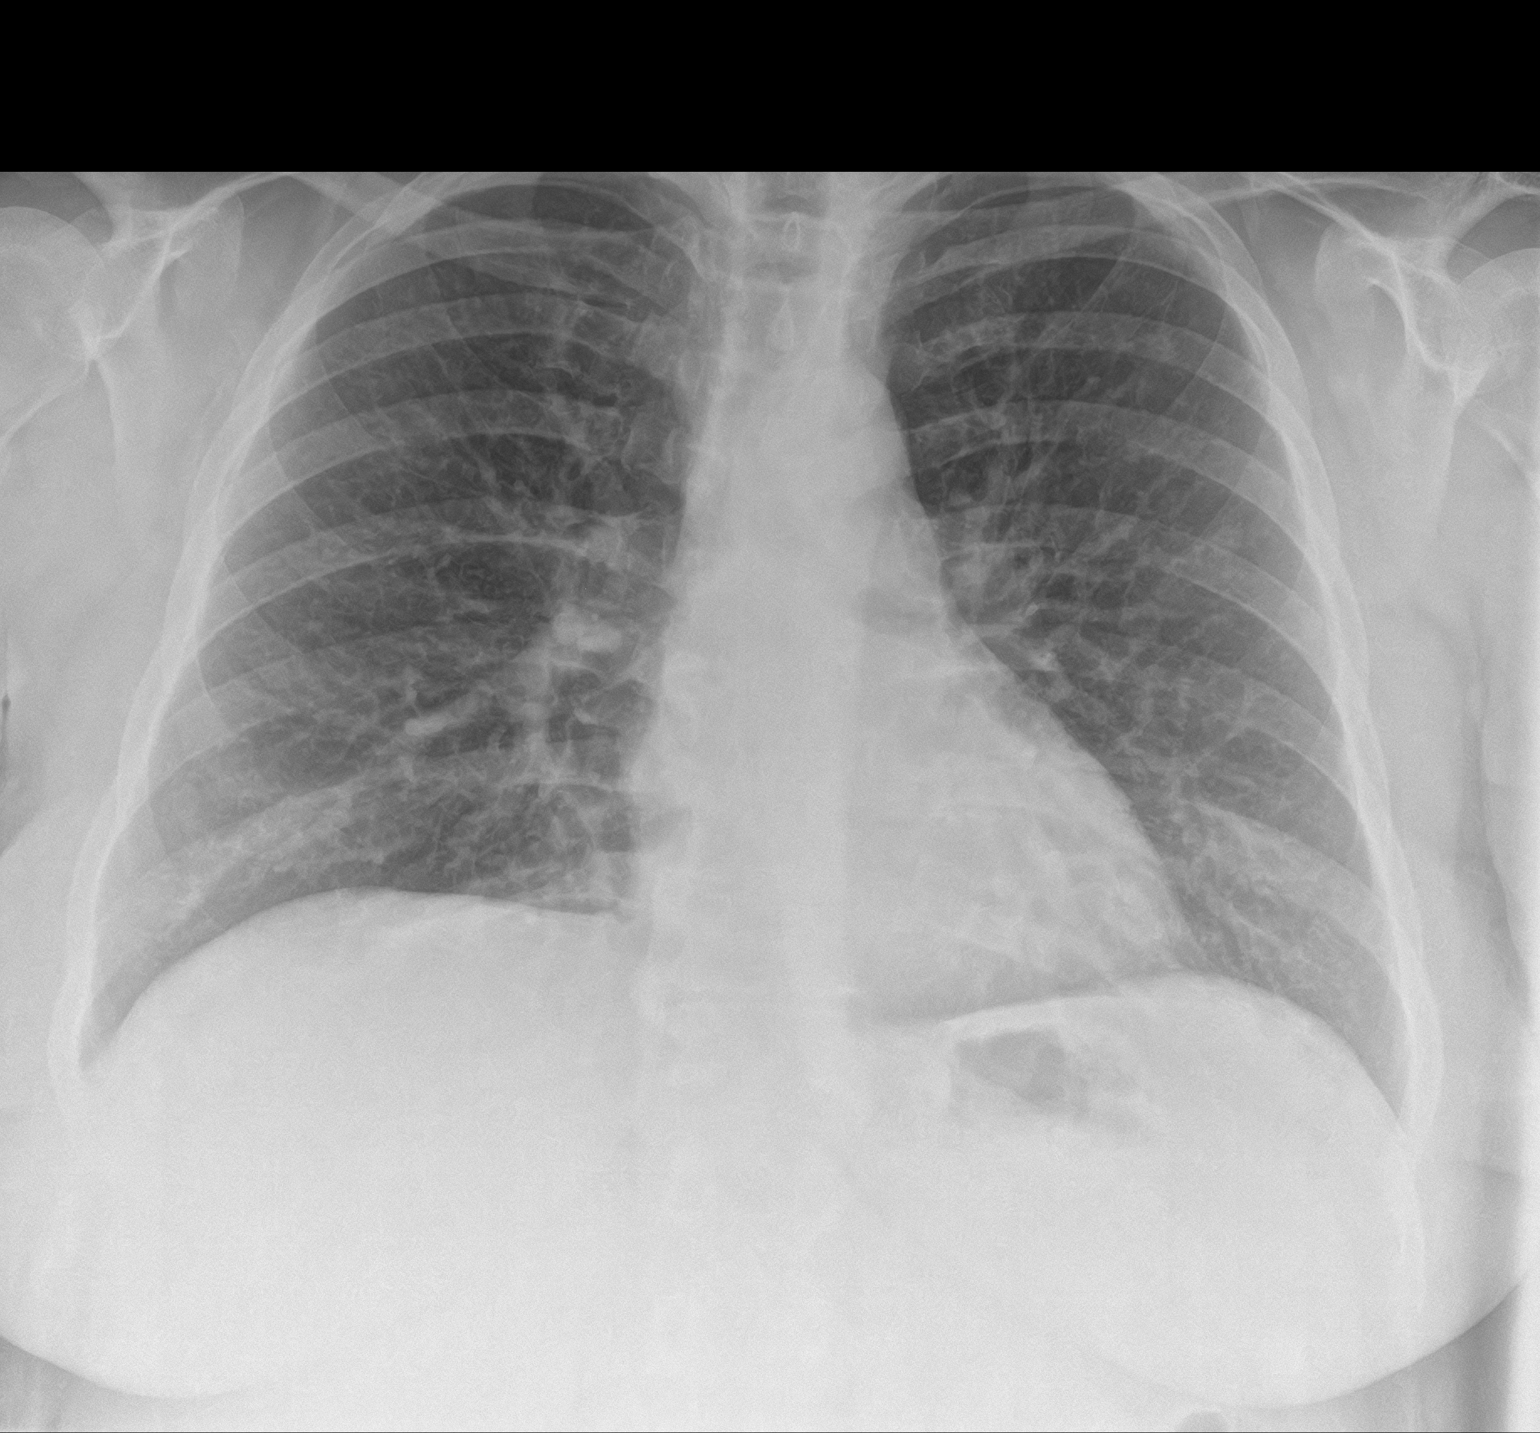
[im 2/2]
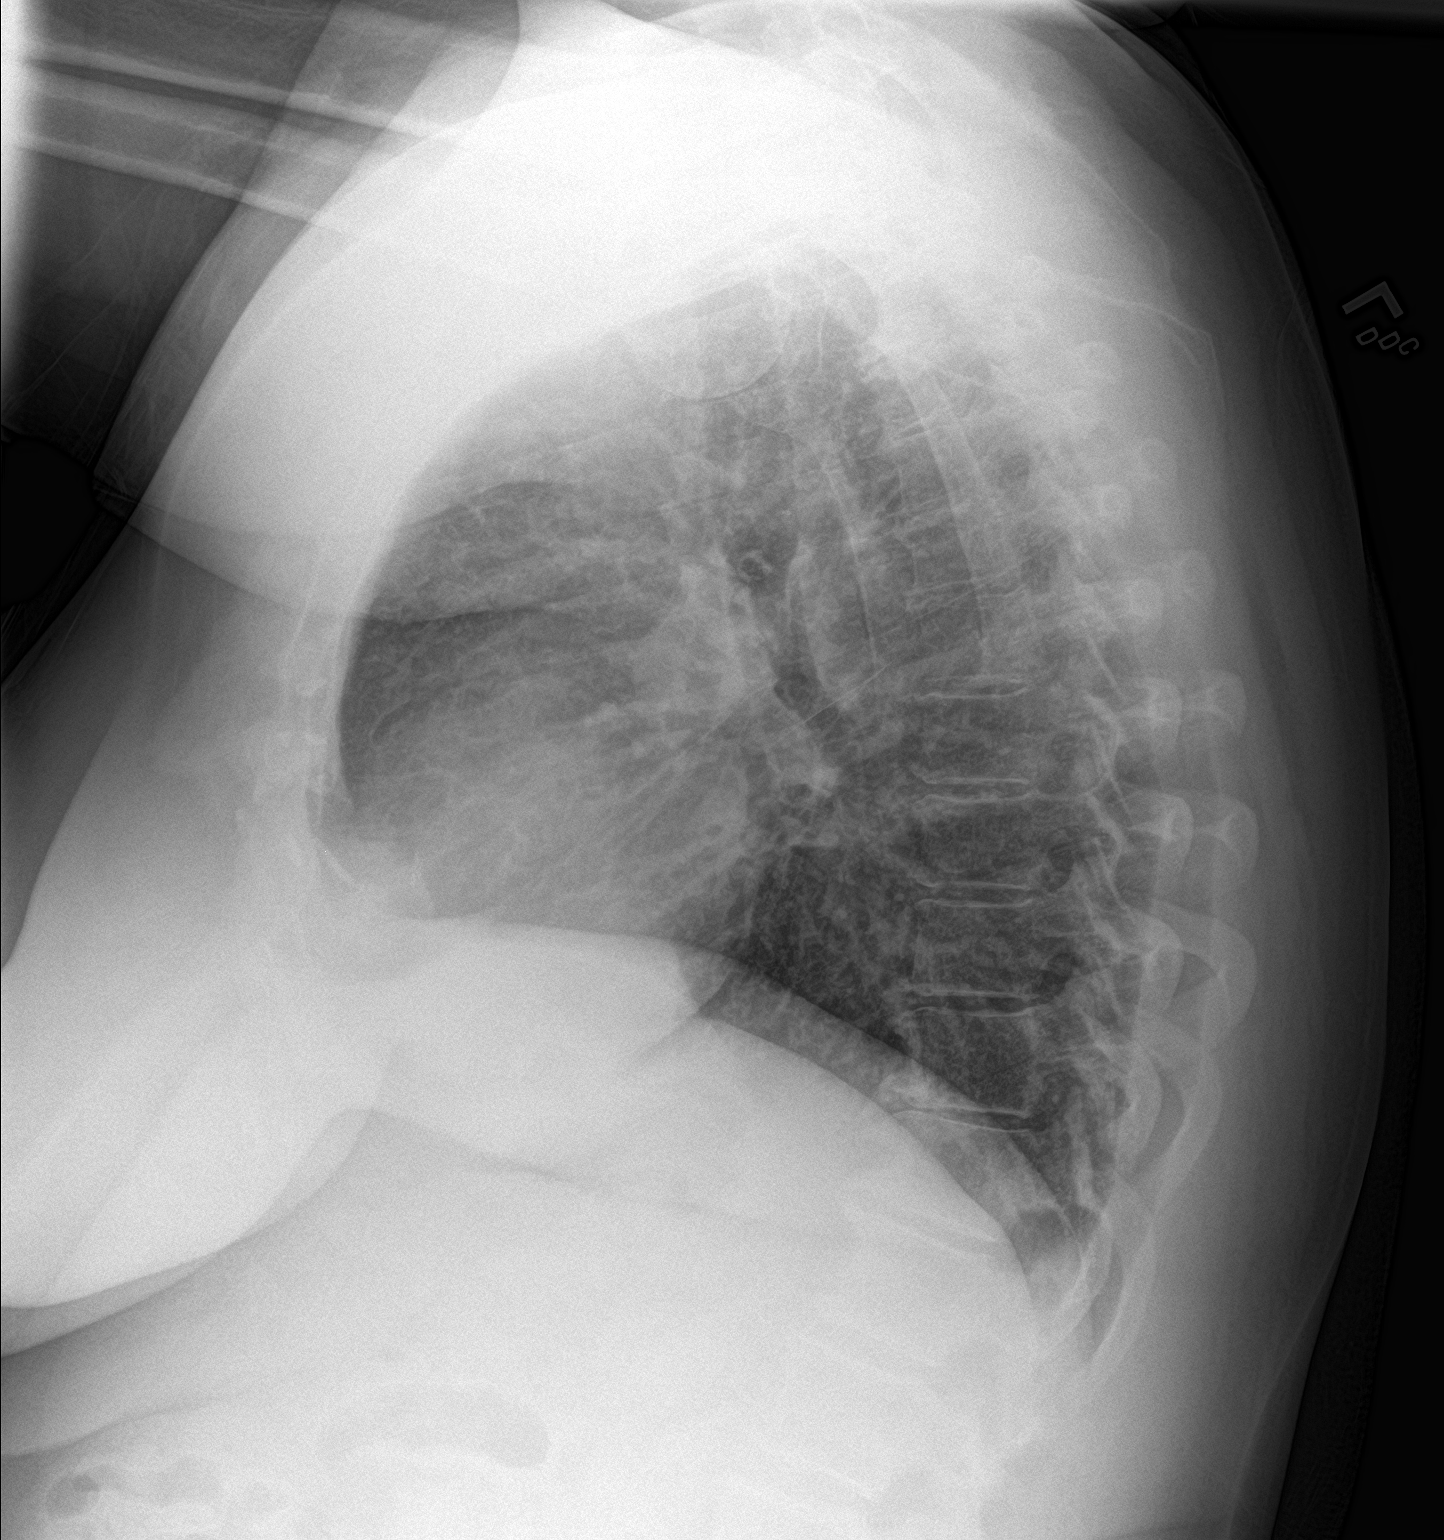

[2 of 2 positions shown; findings below may reference images not displayed]

FINDINGS: The heart size and mediastinal contours are within normal limits.
Both lungs are clear. No pleural effusion or pneumothorax. The
visualized skeletal structures are unremarkable.
IMPRESSION: No active cardiopulmonary disease.

## 2017-11-02 ENCOUNTER — Encounter (INDEPENDENT_AMBULATORY_CARE_PROVIDER_SITE_OTHER): Payer: Self-pay | Admitting: Physician Assistant

## 2017-11-02 ENCOUNTER — Ambulatory Visit (INDEPENDENT_AMBULATORY_CARE_PROVIDER_SITE_OTHER): Payer: 59 | Admitting: Physician Assistant

## 2017-11-02 DIAGNOSIS — Z96641 Presence of right artificial hip joint: Secondary | ICD-10-CM

## 2017-11-02 NOTE — Progress Notes (Signed)
HPI: Heather Orr returns today follow-up of her right total hip arthroplasty 41 days postop.  She is overall doing well.  She does have some stiffness in the hip whenever she first begins to ambulate some soreness with prolonged walking.  She does feel that her range of motion strength are improving.  She said no fevers chills shortness of breath chest pain.  Physical exam: Right hip good range of motion without pain.  Ambulates about the room without any assistive device.  Calf supple nontender.  Impression: Status post right total hip arthroplasty  Plan: She will continue to work on range of motion strengthening.  Have her return to work on 11/06/2017.  She will follow-up with [place] in 1 month check her progress lack of.  Questions encouraged and answered at length.

## 2017-11-06 ENCOUNTER — Telehealth (INDEPENDENT_AMBULATORY_CARE_PROVIDER_SITE_OTHER): Payer: Self-pay | Admitting: Orthopaedic Surgery

## 2017-11-06 NOTE — Telephone Encounter (Signed)
Please advise 

## 2017-11-06 NOTE — Telephone Encounter (Signed)
Patient called advised she need to speak with Dr Ninfa Linden concerning her returning to work. Patient said her employer want to know if it is ok with Dr Ninfa Linden before she return to work. Patient said she was planning on going back to work full time if that is ok with Dr Ninfa Linden.  The number to contact patient is [phone]

## 2017-11-06 NOTE — Telephone Encounter (Signed)
I am fine with her returning to her full-time job.

## 2017-11-07 ENCOUNTER — Telehealth (INDEPENDENT_AMBULATORY_CARE_PROVIDER_SITE_OTHER): Payer: Self-pay | Admitting: Orthopaedic Surgery

## 2017-11-07 ENCOUNTER — Encounter (INDEPENDENT_AMBULATORY_CARE_PROVIDER_SITE_OTHER): Payer: Self-pay

## 2017-11-07 NOTE — Telephone Encounter (Signed)
Patient called back, told her that Dr. Ninfa Linden wrote back and said it would be okay for her to go back to full time work. She requested the note to be emailed to her at [email or web address]

## 2017-11-07 NOTE — Telephone Encounter (Signed)
I see a work note from 11/02/17, also a message from today in your box.  I had not gotten this one done yet. Deferring to you to do the new note for her, thank you.

## 2017-11-07 NOTE — Telephone Encounter (Signed)
Can you email this to her please? It's in her chart under letters

## 2017-11-07 NOTE — Telephone Encounter (Signed)
Emailed to patient.

## 2017-11-07 NOTE — Telephone Encounter (Signed)
Emailed letter to patient.

## 2017-11-17 ENCOUNTER — Telehealth (INDEPENDENT_AMBULATORY_CARE_PROVIDER_SITE_OTHER): Payer: Self-pay | Admitting: Orthopaedic Surgery

## 2017-11-17 NOTE — Telephone Encounter (Signed)
Patient call needing copy of records for [facility]. She has already signed a release form. I advised her copy was ready and she requested they be mailed to her. [place] [phone]

## 2017-12-04 ENCOUNTER — Ambulatory Visit (INDEPENDENT_AMBULATORY_CARE_PROVIDER_SITE_OTHER): Payer: 59 | Admitting: Orthopaedic Surgery

## 2017-12-04 ENCOUNTER — Encounter (INDEPENDENT_AMBULATORY_CARE_PROVIDER_SITE_OTHER): Payer: Self-pay | Admitting: Orthopaedic Surgery

## 2017-12-04 DIAGNOSIS — M25562 Pain in left knee: Secondary | ICD-10-CM | POA: Diagnosis not present

## 2017-12-04 DIAGNOSIS — Z96641 Presence of right artificial hip joint: Secondary | ICD-10-CM

## 2017-12-04 DIAGNOSIS — G8929 Other chronic pain: Secondary | ICD-10-CM | POA: Diagnosis not present

## 2017-12-04 MED ORDER — METHYLPREDNISOLONE ACETATE 40 MG/ML IJ SUSP
40.0000 mg | INTRAMUSCULAR | Status: AC | PRN
  Administered 2017-12-04: 40 mg via INTRA_ARTICULAR

## 2017-12-04 MED ORDER — LIDOCAINE HCL 1 % IJ SOLN
3.0000 mL | INTRAMUSCULAR | Status: AC | PRN
  Administered 2017-12-04: 3 mL
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
## 2017-12-13 ENCOUNTER — Telehealth (INDEPENDENT_AMBULATORY_CARE_PROVIDER_SITE_OTHER): Payer: Self-pay | Admitting: Orthopaedic Surgery

## 2017-12-13 NOTE — Telephone Encounter (Signed)
Faxed note to provided number

## 2017-12-13 NOTE — Telephone Encounter (Signed)
Heather Orr at [facility] needs a fax sent over with pre dental antibiotic information/ with instructions on how patient should take it. She had a hip replacement 09/22/17. Please fax to # [phone]

## 2018-01-15 ENCOUNTER — Other Ambulatory Visit: Payer: Self-pay | Admitting: Family Medicine

## 2018-03-08 ENCOUNTER — Ambulatory Visit (INDEPENDENT_AMBULATORY_CARE_PROVIDER_SITE_OTHER): Payer: Self-pay

## 2018-03-08 ENCOUNTER — Encounter (INDEPENDENT_AMBULATORY_CARE_PROVIDER_SITE_OTHER): Payer: Self-pay | Admitting: Physician Assistant

## 2018-03-08 ENCOUNTER — Ambulatory Visit (INDEPENDENT_AMBULATORY_CARE_PROVIDER_SITE_OTHER): Payer: 59 | Admitting: Physician Assistant

## 2018-03-08 DIAGNOSIS — M7061 Trochanteric bursitis, right hip: Secondary | ICD-10-CM

## 2018-03-08 DIAGNOSIS — Z96641 Presence of right artificial hip joint: Secondary | ICD-10-CM

## 2018-03-08 MED ORDER — METHYLPREDNISOLONE ACETATE 40 MG/ML IJ SUSP
40.0000 mg | INTRAMUSCULAR | Status: AC | PRN
  Administered 2018-03-08: 40 mg via INTRA_ARTICULAR

## 2018-03-08 MED ORDER — LIDOCAINE HCL 1 % IJ SOLN
3.0000 mL | INTRAMUSCULAR | Status: AC | PRN
  Administered 2018-03-08: 3 mL
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
## 2018-03-09 ENCOUNTER — Encounter: Payer: Self-pay | Admitting: Family Medicine

## 2018-03-09 ENCOUNTER — Ambulatory Visit (INDEPENDENT_AMBULATORY_CARE_PROVIDER_SITE_OTHER): Payer: 59 | Admitting: Family Medicine

## 2018-03-09 VITALS — BP 122/82 | HR 76 | Temp 98.3°F | Wt 242.2 lb

## 2018-03-09 DIAGNOSIS — H8112 Benign paroxysmal vertigo, left ear: Secondary | ICD-10-CM

## 2018-03-09 MED ORDER — MECLIZINE HCL 25 MG PO TABS: 25.0000 mg | ORAL_TABLET | Freq: Three times a day (TID) | ORAL | 0 refills | Status: DC | PRN

## 2018-03-09 NOTE — Progress Notes (Signed)
[name] Female    DOB: 11-25-1965   52 y.o.   MRN: [id number] Visit Date: 03/09/2018  Today's Provider: [name], MD   Chief Complaint  Patient presents with  . Dizziness   Subjective:    I, Tiburcio Pea, CMA, am acting as a scribe for [name], MD.   Dizziness  This is a new problem. Episode onset: 4-5 days ago. The problem occurs constantly. The problem has been gradually worsening. Associated symptoms include headaches and nausea. Pertinent negatives include no numbness, visual change, vomiting or weakness. The symptoms are aggravated by bending (turning her head). She has tried nothing for the symptoms.   Occurred 1-2 days after chiropractic manipulation.  No other neuro symptoms.  Patient also notes that she quit smoking the day before her THR in 09/2017.    Allergies  Allergen Reactions  . Nexium [Esomeprazole Magnesium] Hives and Shortness Of Breath     Current Outpatient Medications:  .  citalopram (CELEXA) 40 MG tablet, TAKE ONE TABLET BY MOUTH ONCE DAILY. NEED APPT FOR MORE REFILLS, Disp: 90 tablet, Rfl: 3 .  meloxicam (MOBIC) 15 MG tablet, Take 15 mg by mouth daily., Disp: , Rfl:  .  methocarbamol (ROBAXIN) 500 MG tablet, Take 1 tablet (500 mg total) by mouth every 6 (six) hours as needed for muscle spasms. (Patient not taking: Reported on 03/09/2018), Disp: 60 tablet, Rfl: 0  Review of Systems  Constitutional: Negative.   Eyes: Negative for visual disturbance.  Respiratory: Negative.   Cardiovascular: Negative.   Gastrointestinal: Positive for nausea. Negative for vomiting.  Musculoskeletal: Negative.   Neurological: Positive for dizziness and headaches. Negative for weakness and numbness.    Social History   Tobacco Use  . Smoking status: Current Every Day Smoker    Packs/day: 0.50    Years: 30.00    Pack years: 15.00    Types: Cigarettes  . Smokeless tobacco: Never Used  Substance Use Topics  . Alcohol use:  Yes    Comment: 1 beer weekly; occasionally    Objective:   BP 122/82 (BP Location: Left Arm, Patient Position: Sitting, Cuff Size: Large)   Pulse 76   Temp 98.3 F (36.8 C) (Oral)   Wt 242 lb 3.2 oz (109.9 kg)   SpO2 97%   BMI 39.09 kg/m  Vitals:   03/09/18 0951  BP: 122/82  Pulse: 76  Temp: 98.3 F (36.8 C)  TempSrc: Oral  SpO2: 97%  Weight: 242 lb 3.2 oz (109.9 kg)     Physical Exam  Constitutional: She is oriented to person, place, and time. She appears well-developed and well-nourished. No distress.  HENT:  Head: Normocephalic and atraumatic.  Mouth/Throat: Oropharynx is clear and moist.  + Epleys to the Left  Eyes: Pupils are equal, round, and reactive to light. Conjunctivae and EOM are normal. No scleral icterus.  Neck: Neck supple. No thyromegaly present.  Cardiovascular: Normal rate, regular rhythm, normal heart sounds and intact distal pulses.  No murmur heard. Pulmonary/Chest: Effort normal and breath sounds normal. No respiratory distress. She has no wheezes. She has no rales.  Musculoskeletal: She exhibits no edema.  Lymphadenopathy:    She has no cervical adenopathy.  Neurological: She is alert and oriented to person, place, and time. No cranial nerve deficit or sensory deficit. She exhibits normal muscle tone.  Skin: Skin is warm and dry. Capillary refill takes less than 2 seconds. No rash noted.  Psychiatric: She has a  normal mood and affect. Her behavior is normal.  Vitals reviewed.      Assessment & Plan:   1. Benign paroxysmal positional vertigo of left ear - classic signs and symptoms of BPPV - discussed slowing for movements and position changes - avoid chiropractic manipulation for now - trial of meclizine - discussed home Epley maneuver - could consider vestibular PT - no signs of vertebral dissection, despite this occurring after chiropractic manipulation - patient is neuro intact.   Meds ordered this encounter  Medications  .  meclizine (ANTIVERT) 25 MG tablet    Sig: Take 1 tablet (25 mg total) by mouth 3 (three) times daily as needed for dizziness.    Dispense:  90 tablet    Refill:  0     Return if symptoms worsen or fail to improve.   The entirety of the information documented in the History of Present Illness, Review of Systems and Physical Exam were personally obtained by me. Portions of this information were initially documented by Tiburcio Pea, CMA and reviewed by me for thoroughness and accuracy.    Virginia Crews, MD, MPH [facility] 03/09/2018 10:17 AM

## 2018-03-09 NOTE — Patient Instructions (Signed)

## 2018-04-23 ENCOUNTER — Ambulatory Visit (INDEPENDENT_AMBULATORY_CARE_PROVIDER_SITE_OTHER): Payer: 59 | Admitting: Physician Assistant

## 2018-04-23 ENCOUNTER — Encounter (INDEPENDENT_AMBULATORY_CARE_PROVIDER_SITE_OTHER): Payer: Self-pay | Admitting: Physician Assistant

## 2018-04-23 DIAGNOSIS — M25551 Pain in right hip: Secondary | ICD-10-CM | POA: Diagnosis not present

## 2018-04-23 DIAGNOSIS — Z96641 Presence of right artificial hip joint: Secondary | ICD-10-CM

## 2018-04-23 NOTE — Progress Notes (Signed)
HPI: [name] returns today now 7 months status post right total hip arthroplasty.  She states that she continues to have pain about the right hip.  Especially with bending forward.  She states since the last visit she has thought about the hip. She notes the hip does not feel like it is popping out as she had discussed last time.  The hip is painful with bending forward and also will be extreme internal rotation of the hip.  She notes that the trochanteric injection gave her no relief.  Notes other than the pain in the hip with bending and extreme internal rotation she is very happy with her results thus far. She has chronic back pain underwent an MRI of the lumbar spine August 09, 2017 which showed moderate spinal canal and neural foraminal L4-5 central annular fissure at this level also.  Severe left and moderate right neural foraminal stenosis at L5-S1.  She has daily numbness in both legs. The numbness in both  legs goes down to the thighs.  States this occurs if she walks too slowly or standing still.  Denies any waking pain.  Never has numbness or tingling beyond the knees  Review of systems: Please see HPI otherwise negative  Physical exam: General: Well-developed well-nourished female no acute distress mood affect appropriate Right hip: Good range of motion with discomfort with extreme internal rotation. Bilateral lower extremity she has 5 out of 5 strength throughout the lower extremities against resistance.  Negative straight leg raise bilaterally.  She has tenderness over the trochanteric region of both hips.  Impression: Status post right total hip arthroplasty 7 months  Plan: We will send her to formal physical therapy for range of motion strengthening the right hip.  Also for core strengthening and modalities.  She will follow-up with [place] in 6 weeks check progress lack of.  Questions encouraged and answered at length.

## 2018-04-26 DIAGNOSIS — M25551 Pain in right hip: Secondary | ICD-10-CM | POA: Diagnosis not present

## 2018-04-26 DIAGNOSIS — Z96641 Presence of right artificial hip joint: Secondary | ICD-10-CM | POA: Diagnosis not present

## 2018-06-06 ENCOUNTER — Encounter (INDEPENDENT_AMBULATORY_CARE_PROVIDER_SITE_OTHER): Payer: Self-pay | Admitting: Orthopaedic Surgery

## 2018-06-06 ENCOUNTER — Ambulatory Visit (INDEPENDENT_AMBULATORY_CARE_PROVIDER_SITE_OTHER): Payer: 59 | Admitting: Orthopaedic Surgery

## 2018-06-06 DIAGNOSIS — Z96641 Presence of right artificial hip joint: Secondary | ICD-10-CM | POA: Diagnosis not present

## 2018-06-06 NOTE — Progress Notes (Signed)
The patient is now 8 months status post a right total hip arthroplasty.  She is doing well overall.  She still has some pain around the lateral aspect of her hip when she is first up and she feels like it is more of a muscle pain.  She denies any groin pain.  We have injected the trochanteric area before.  She is been to therapy as well.  She is walking without a limp.  She does not use an assistive device to walk around with.  She has full range of motion of her right hip with no blocks to motion and no difficulty at all.  Her leg lengths are equal.  She does have some pain to palpation of the trochanteric area.  At this point she will continue increase her activities as comfort allows and I gave her reassurance that she should do well as time goes by.  We will see her back at her one-year follow-up appointment with a standing low AP pelvis and lateral of her right operative hip.

## 2018-06-10 ENCOUNTER — Other Ambulatory Visit: Payer: Self-pay | Admitting: Family Medicine

## 2018-06-21 DIAGNOSIS — L905 Scar conditions and fibrosis of skin: Secondary | ICD-10-CM | POA: Diagnosis not present

## 2018-06-21 DIAGNOSIS — L309 Dermatitis, unspecified: Secondary | ICD-10-CM | POA: Diagnosis not present

## 2018-06-21 DIAGNOSIS — Z1283 Encounter for screening for malignant neoplasm of skin: Secondary | ICD-10-CM | POA: Diagnosis not present

## 2018-06-29 IMAGING — DX DG C-ARM 1-60 MIN-NO REPORT
1 series · 2 of 2 positions shown · non-contrast
Comparison: MRI 10/12/2016

CLINICAL DATA: Right total hip replacement

EXAM:
OPERATIVE RIGHT HIP (WITH PELVIS IF PERFORMED) 2 VIEWS
TECHNIQUE: Fluoroscopic spot image(s) were submitted for interpretation
post-operatively.

[Series 1: pelvis ap · 0.14mm/px · 2 of 2 slices shown]
[im 1/2]
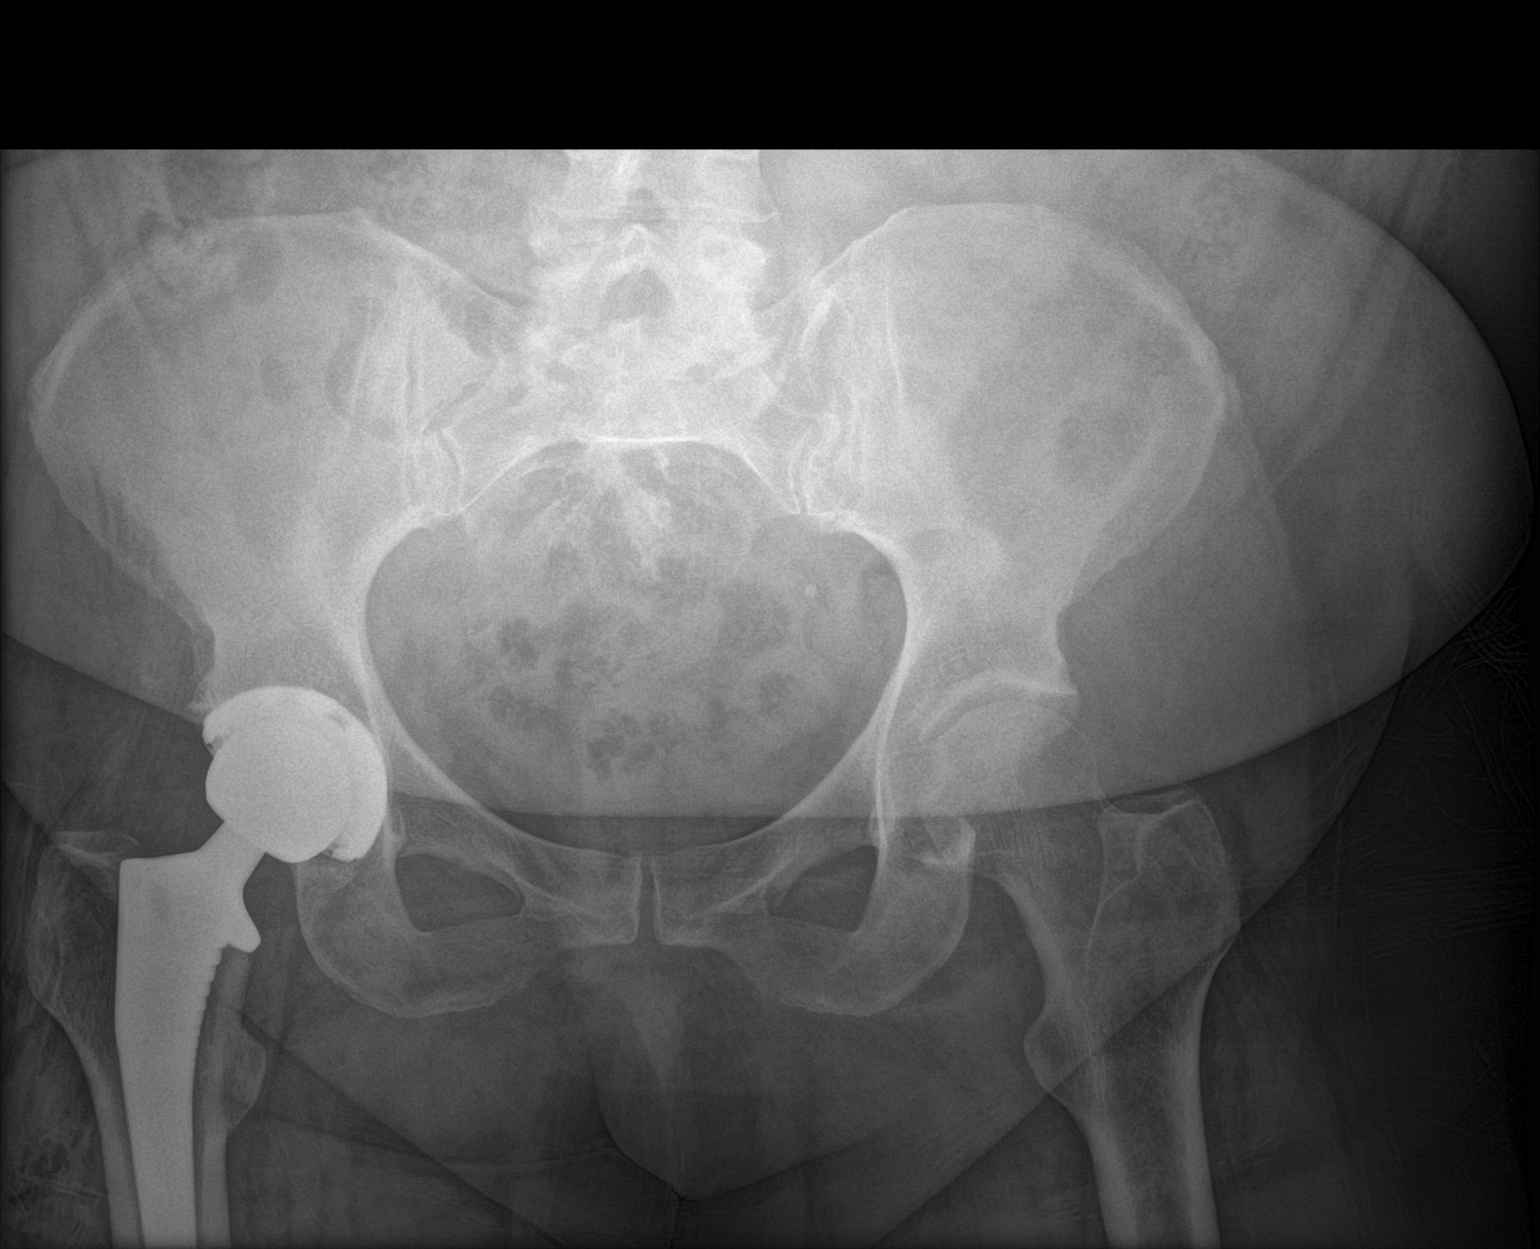
[im 2/2]
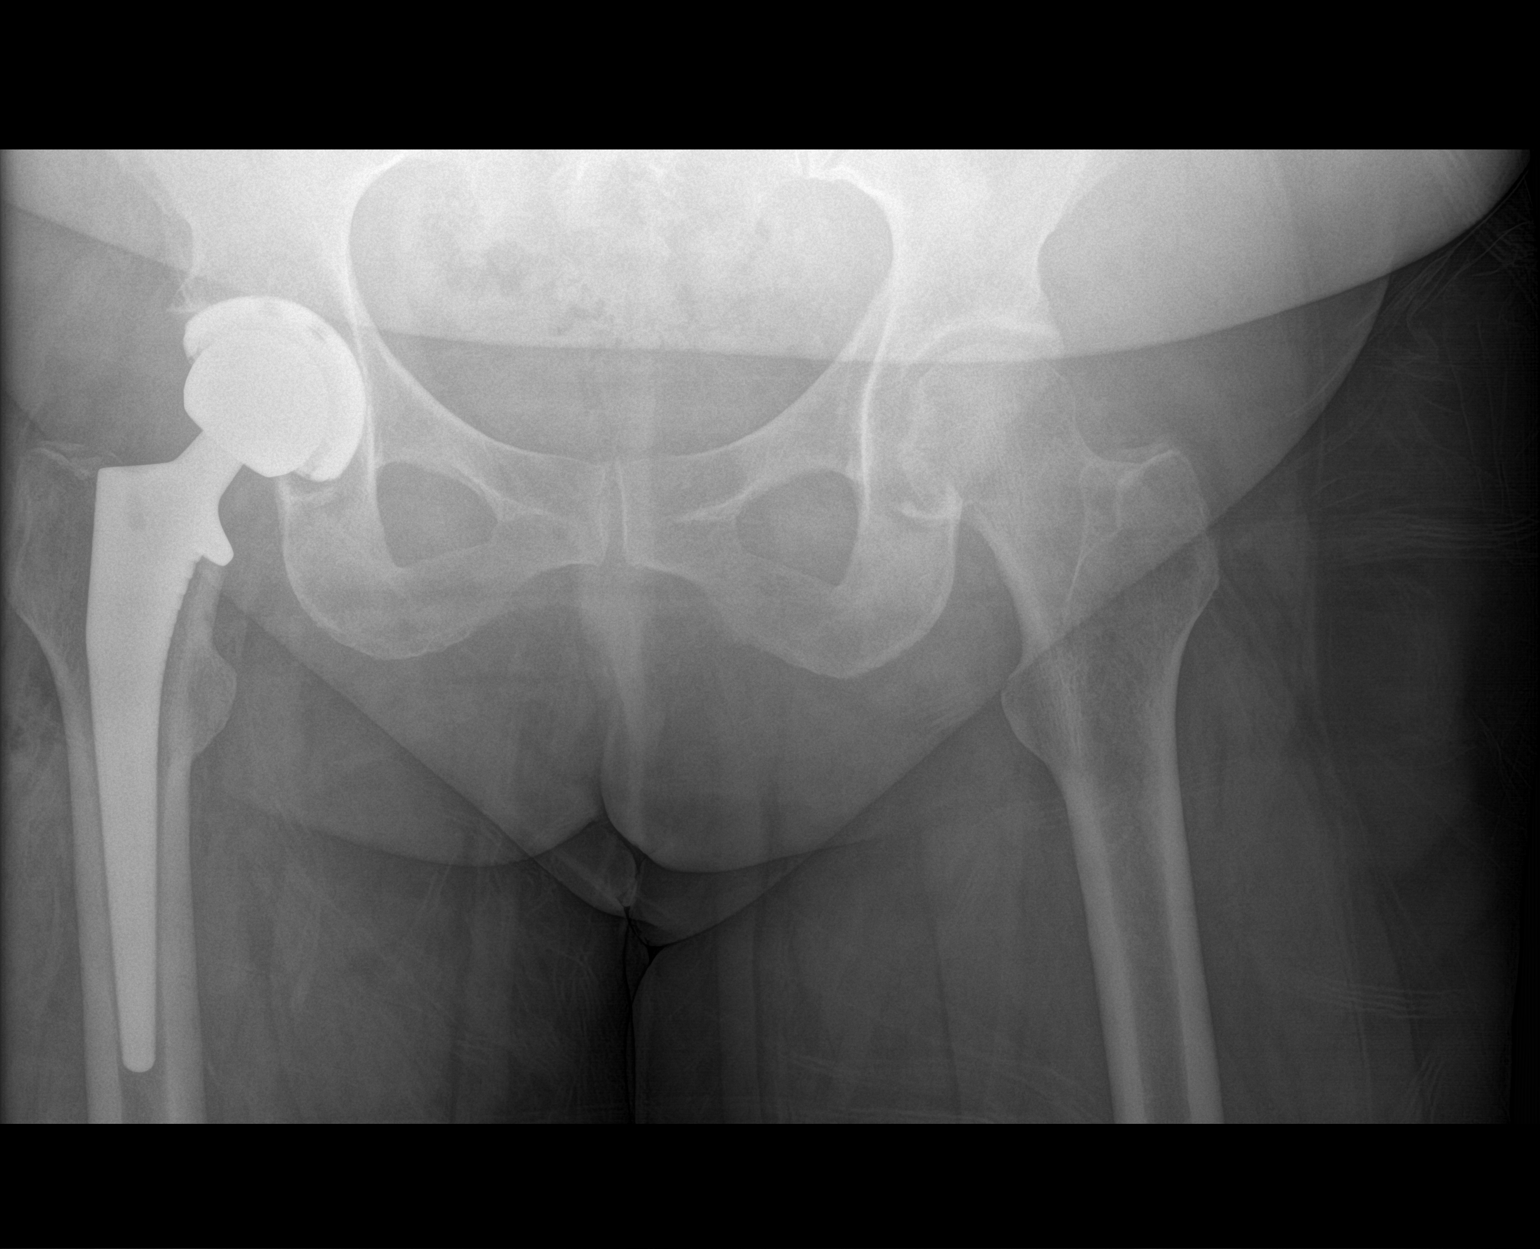

[2 of 2 positions shown; findings below may reference images not displayed]

FINDINGS: Changes of right hip replacement. Normal AP alignment. No visible
hardware complicating feature.
IMPRESSION: Right hip replacement.  No visible complicating feature.

## 2018-07-09 ENCOUNTER — Encounter: Payer: Self-pay | Admitting: Family Medicine

## 2018-07-09 ENCOUNTER — Ambulatory Visit (INDEPENDENT_AMBULATORY_CARE_PROVIDER_SITE_OTHER): Payer: 59 | Admitting: Family Medicine

## 2018-07-09 VITALS — BP 130/89 | HR 91 | Temp 97.6°F | Ht 66.0 in | Wt 254.6 lb

## 2018-07-09 DIAGNOSIS — E7800 Pure hypercholesterolemia, unspecified: Secondary | ICD-10-CM

## 2018-07-09 DIAGNOSIS — J302 Other seasonal allergic rhinitis: Secondary | ICD-10-CM | POA: Diagnosis not present

## 2018-07-09 DIAGNOSIS — J309 Allergic rhinitis, unspecified: Secondary | ICD-10-CM | POA: Insufficient documentation

## 2018-07-09 DIAGNOSIS — H8112 Benign paroxysmal vertigo, left ear: Secondary | ICD-10-CM

## 2018-07-09 DIAGNOSIS — Z1239 Encounter for other screening for malignant neoplasm of breast: Secondary | ICD-10-CM

## 2018-07-09 DIAGNOSIS — Z0000 Encounter for general adult medical examination without abnormal findings: Secondary | ICD-10-CM

## 2018-07-09 DIAGNOSIS — R002 Palpitations: Secondary | ICD-10-CM | POA: Diagnosis not present

## 2018-07-09 DIAGNOSIS — D72829 Elevated white blood cell count, unspecified: Secondary | ICD-10-CM | POA: Diagnosis not present

## 2018-07-09 MED ORDER — FLUTICASONE PROPIONATE 50 MCG/ACT NA SUSP: 2.0000 | Freq: Every day | NASAL | 6 refills | Status: DC

## 2018-07-09 NOTE — Assessment & Plan Note (Signed)
Ongoing problem, but worsening EKG NSR today Given frequency of palpitations, will likely benefit from Holter monitor May benefit from beta blocker Patient plans to f/u with Cardiology - she will make appt
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
## 2018-07-09 NOTE — Assessment & Plan Note (Signed)
Referral to ENT for further evaluation as this is still ongoing flonase may help Could consider vestibular PT

## 2018-07-09 NOTE — Patient Instructions (Addendum)
Palpitations Palpitations are feelings that your heartbeat is irregular or is faster than normal. It may feel like your heart is fluttering or skipping a beat. Palpitations are usually not a serious problem. They may be caused by many things, including smoking, caffeine, alcohol, stress, and certain medicines or drugs. Most causes of palpitations are not serious. However, some palpitations can be a sign of a serious problem. You may need further tests to rule out serious medical problems. Follow these instructions at home:     Pay attention to any changes in your condition. Take these actions to help manage your symptoms: Eating and drinking  Avoid foods and drinks that may cause palpitations. These may include: ? Caffeinated coffee, tea, soft drinks, diet pills, and energy drinks. ? Chocolate. ? Alcohol. Lifestyle  Take steps to reduce your stress and anxiety. Things that can help you relax include: ? Yoga. ? Mind-body activities, such as deep breathing, meditation, or using words and images to create positive thoughts (guided imagery). ? Physical activity, such as swimming, jogging, or walking. Tell your health care provider if your palpitations increase with activity. If you have chest pain or shortness of breath with activity, do not continue the activity until you are seen by your health care provider. ? Biofeedback. This is a method that helps you learn to use your mind to control things in your body, such as your heartbeat.  Do not use drugs, including cocaine or ecstasy. Do not use marijuana.  Get plenty of rest and sleep. Keep a regular bed time. General instructions  Take over-the-counter and prescription medicines only as told by your health care provider.  Do not use any products that contain nicotine or tobacco, such as cigarettes and e-cigarettes. If you need help quitting, ask your health care provider.  Keep all follow-up visits as told by your health care provider. This  is important. These may include visits for further testing if palpitations do not go away or get worse. Contact a health care provider if you:  Continue to have a fast or irregular heartbeat after 24 hours.  Notice that your palpitations occur more often. Get help right away if you:  Have chest pain or shortness of breath.  Have a severe headache.  Feel dizzy or you faint. Summary  Palpitations are feelings that your heartbeat is irregular or is faster than normal. It may feel like your heart is fluttering or skipping a beat.  Palpitations may be caused by many things, including smoking, caffeine, alcohol, stress, certain medicines, and drugs.  Although most causes of palpitations are not serious, some causes can be a sign of a serious medical problem.  Get help right away if you faint or have chest pain, shortness of breath, a severe headache, or dizziness. This information is not intended to replace advice given to you by your health care provider. Make sure you discuss any questions you have with your health care provider. Document Released: 05/06/2000 Document Revised: 06/21/2017 Document Reviewed: 06/21/2017 Elsevier Interactive Patient Education  2019 [place] Years, Female Preventive care refers to lifestyle choices and visits with your health care provider that can promote health and wellness. What does preventive care include?   A yearly physical exam. This is also called an annual well check.  Dental exams once or twice a year.  Routine eye exams. Ask your health care provider how often you should have your eyes checked.  Personal lifestyle choices, including: ? Daily  care of your teeth and gums. ? Regular physical activity. ? Eating a healthy diet. ? Avoiding tobacco and drug use. ? Limiting alcohol use. ? Practicing safe sex. ? Taking low-dose aspirin daily starting at age 74. ? Taking vitamin and mineral supplements as  recommended by your health care provider. What happens during an annual well check? The services and screenings done by your health care provider during your annual well check will depend on your age, overall health, lifestyle risk factors, and family history of disease. Counseling Your health care provider may ask you questions about your:  Alcohol use.  Tobacco use.  Drug use.  Emotional well-being.  Home and relationship well-being.  Sexual activity.  Eating habits.  Work and work Statistician.  Method of birth control.  Menstrual cycle.  Pregnancy history. Screening You may have the following tests or measurements:  Height, weight, and BMI.  Blood pressure.  Lipid and cholesterol levels. These may be checked every 5 years, or more frequently if you are over 17 years old.  Skin check.  Lung cancer screening. You may have this screening every year starting at age 62 if you have a 30-pack-year history of smoking and currently smoke or have quit within the past 15 years.  Colorectal cancer screening. All adults should have this screening starting at age 95 and continuing until age 42. Your health care provider may recommend screening at age 53. You will have tests every 1-10 years, depending on your results and the type of screening test. People at increased risk should start screening at an earlier age. Screening tests may include: ? Guaiac-based fecal occult blood testing. ? Fecal immunochemical test (FIT). ? Stool DNA test. ? Virtual colonoscopy. ? Sigmoidoscopy. During this test, a flexible tube with a tiny camera (sigmoidoscope) is used to examine your rectum and lower colon. The sigmoidoscope is inserted through your anus into your rectum and lower colon. ? Colonoscopy. During this test, a long, thin, flexible tube with a tiny camera (colonoscope) is used to examine your entire colon and rectum.  Hepatitis C blood test.  Hepatitis B blood test.  Sexually  transmitted disease (STD) testing.  Diabetes screening. This is done by checking your blood sugar (glucose) after you have not eaten for a while (fasting). You may have this done every 1-3 years.  Mammogram. This may be done every 1-2 years. Talk to your health care provider about when you should start having regular mammograms. This may depend on whether you have a family history of breast cancer.  BRCA-related cancer screening. This may be done if you have a family history of breast, ovarian, tubal, or peritoneal cancers.  Pelvic exam and Pap test. This may be done every 3 years starting at age 12. Starting at age 76, this may be done every 5 years if you have a Pap test in combination with an HPV test.  Bone density scan. This is done to screen for osteoporosis. You may have this scan if you are at high risk for osteoporosis. Discuss your test results, treatment options, and if necessary, the need for more tests with your health care provider. Vaccines Your health care provider may recommend certain vaccines, such as:  Influenza vaccine. This is recommended every year.  Tetanus, diphtheria, and acellular pertussis (Tdap, Td) vaccine. You may need a Td booster every 10 years.  Varicella vaccine. You may need this if you have not been vaccinated.  Zoster vaccine. You may need this after age 87.  Measles, mumps, and rubella (MMR) vaccine. You may need at least one dose of MMR if you were born in 1957 or later. You may also need a second dose.  Pneumococcal 13-valent conjugate (PCV13) vaccine. You may need this if you have certain conditions and were not previously vaccinated.  Pneumococcal polysaccharide (PPSV23) vaccine. You may need one or two doses if you smoke cigarettes or if you have certain conditions.  Meningococcal vaccine. You may need this if you have certain conditions.  Hepatitis A vaccine. You may need this if you have certain conditions or if you travel or work in places  where you may be exposed to hepatitis A.  Hepatitis B vaccine. You may need this if you have certain conditions or if you travel or work in places where you may be exposed to hepatitis B.  Haemophilus influenzae type b (Hib) vaccine. You may need this if you have certain conditions. Talk to your health care provider about which screenings and vaccines you need and how often you need them. This information is not intended to replace advice given to you by your health care provider. Make sure you discuss any questions you have with your health care provider. Document Released: 06/05/2015 Document Revised: 06/29/2017 Document Reviewed: 03/10/2015 Elsevier Interactive Patient Education  2019 [employer].

## 2018-07-09 NOTE — Assessment & Plan Note (Signed)
Discussed diet and exercise Screening labs May need statin

## 2018-07-09 NOTE — Assessment & Plan Note (Signed)
New problem Discussed OTC antihistamines and flonase

## 2018-07-09 NOTE — Assessment & Plan Note (Signed)
Has known atherosclerosis Will recheck FLP Will need to consider statin therapy Goal LDL <70

## 2018-07-10 DIAGNOSIS — R002 Palpitations: Secondary | ICD-10-CM | POA: Diagnosis not present

## 2018-07-10 DIAGNOSIS — Z0000 Encounter for general adult medical examination without abnormal findings: Secondary | ICD-10-CM | POA: Diagnosis not present

## 2018-07-10 DIAGNOSIS — E7800 Pure hypercholesterolemia, unspecified: Secondary | ICD-10-CM | POA: Diagnosis not present

## 2018-07-11 LAB — COMPREHENSIVE METABOLIC PANEL
A/G RATIO: 1.8 (ref 1.2–2.2)
ALK PHOS: 79 IU/L (ref 39–117)
ALT: 48 IU/L — AB (ref 0–32)
AST: 32 IU/L (ref 0–40)
Albumin: 4.3 g/dL (ref 3.8–4.9)
BILIRUBIN TOTAL: 0.5 mg/dL (ref 0.0–1.2)
BUN/Creatinine Ratio: 17 (ref 9–23)
BUN: 11 mg/dL (ref 6–24)
CHLORIDE: 104 mmol/L (ref 96–106)
CO2: 21 mmol/L (ref 20–29)
Calcium: 9.4 mg/dL (ref 8.7–10.2)
Creatinine, Ser: 0.65 mg/dL (ref 0.57–1.00)
GFR calc Af Amer: 117 mL/min/{1.73_m2} (ref >59)
GFR calc non Af Amer: 102 mL/min/{1.73_m2} (ref >59)
GLUCOSE: 97 mg/dL (ref 65–99)
Globulin, Total: 2.4 g/dL (ref 1.5–4.5)
POTASSIUM: 4.4 mmol/L (ref 3.5–5.2)
Sodium: 142 mmol/L (ref 134–144)
Total Protein: 6.7 g/dL (ref 6.0–8.5)

## 2018-07-11 LAB — CBC WITH DIFFERENTIAL/PLATELET
BASOS ABS: 0 10*3/uL (ref 0.0–0.2)
Basos: 1 %
EOS (ABSOLUTE): 0.1 10*3/uL (ref 0.0–0.4)
Eos: 2 %
Hematocrit: 43.2 % (ref 34.0–46.6)
Hemoglobin: 14.8 g/dL (ref 11.1–15.9)
IMMATURE GRANS (ABS): 0 10*3/uL (ref 0.0–0.1)
Immature Granulocytes: 0 %
LYMPHS: 49 %
Lymphocytes Absolute: 3.3 10*3/uL — ABNORMAL HIGH (ref 0.7–3.1)
MCH: 30.6 pg (ref 26.6–33.0)
MCHC: 34.3 g/dL (ref 31.5–35.7)
MCV: 89 fL (ref 79–97)
Monocytes Absolute: 0.4 10*3/uL (ref 0.1–0.9)
Monocytes: 6 %
NEUTROS PCT: 42 %
Neutrophils Absolute: 2.8 10*3/uL (ref 1.4–7.0)
PLATELETS: 327 10*3/uL (ref 150–450)
RBC: 4.83 x10E6/uL (ref 3.77–5.28)
RDW: 12.5 % (ref 11.7–15.4)
WBC: 6.6 10*3/uL (ref 3.4–10.8)

## 2018-07-11 LAB — LIPID PANEL
CHOL/HDL RATIO: 4.1 ratio (ref 0.0–4.4)
Cholesterol, Total: 189 mg/dL (ref 100–199)
HDL: 46 mg/dL (ref >39)
LDL CALC: 110 mg/dL — AB (ref 0–99)
TRIGLYCERIDES: 167 mg/dL — AB (ref 0–149)
VLDL Cholesterol Cal: 33 mg/dL (ref 5–40)

## 2018-07-11 LAB — TSH: TSH: 1.17 u[IU]/mL (ref 0.450–4.500)

## 2018-07-12 ENCOUNTER — Telehealth: Payer: Self-pay

## 2018-07-12 NOTE — Telephone Encounter (Signed)
-----   Message from Virginia Crews, MD sent at 07/11/2018  4:55 PM EST ----- Cholesterol is improving over last 2 years, but still a bit high.  10 year risk of heart disease/stroke is low at 1.9%. No indication for medications.  Recommend diet low in saturated fats and regular exercise. Normal kidney function, blood sugar, electrolytes, Blood counts, Thyroid function.  Liver function is slightly elevated and has climbed slightly over last year.  This could be a sign of fatty liver changes.  Dietary changes will help this also

## 2018-07-12 NOTE — Telephone Encounter (Signed)
LVMTRC 

## 2018-07-13 NOTE — Telephone Encounter (Signed)
Patient was advised.  

## 2018-07-18 DIAGNOSIS — R42 Dizziness and giddiness: Secondary | ICD-10-CM | POA: Diagnosis not present

## 2018-07-18 DIAGNOSIS — H9319 Tinnitus, unspecified ear: Secondary | ICD-10-CM | POA: Diagnosis not present

## 2018-07-23 DIAGNOSIS — R42 Dizziness and giddiness: Secondary | ICD-10-CM | POA: Diagnosis not present

## 2018-07-30 DIAGNOSIS — R42 Dizziness and giddiness: Secondary | ICD-10-CM | POA: Diagnosis not present

## 2018-10-03 ENCOUNTER — Ambulatory Visit: Payer: Self-pay | Admitting: Orthopaedic Surgery

## 2018-10-11 ENCOUNTER — Telehealth: Payer: Self-pay

## 2018-10-11 NOTE — Telephone Encounter (Signed)
Patient is going to call back in the morning 10/12/2018 to complete the PHQ9 screening.

## 2018-11-14 ENCOUNTER — Other Ambulatory Visit: Payer: Self-pay

## 2018-11-14 ENCOUNTER — Ambulatory Visit: Payer: Self-pay

## 2018-11-14 ENCOUNTER — Ambulatory Visit (INDEPENDENT_AMBULATORY_CARE_PROVIDER_SITE_OTHER): Payer: BC Managed Care – PPO | Admitting: Orthopaedic Surgery

## 2018-11-14 ENCOUNTER — Encounter: Payer: Self-pay | Admitting: Orthopaedic Surgery

## 2018-11-14 DIAGNOSIS — M25562 Pain in left knee: Secondary | ICD-10-CM
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
## 2018-11-15 ENCOUNTER — Other Ambulatory Visit: Payer: Self-pay

## 2018-11-15 DIAGNOSIS — G8929 Other chronic pain: Secondary | ICD-10-CM

## 2018-11-15 DIAGNOSIS — M25562 Pain in left knee: Secondary | ICD-10-CM

## 2018-11-19 ENCOUNTER — Encounter: Payer: Self-pay | Admitting: Orthopaedic Surgery

## 2018-11-21 ENCOUNTER — Telehealth: Payer: Self-pay

## 2018-11-21 NOTE — Telephone Encounter (Signed)
Pt made aware order was sent to [facility] imaging and they will be contacting her to schedule appt, I faxed order to [phone]

## 2018-11-21 NOTE — Telephone Encounter (Signed)
Patient would like to know if MRI location could be changed to [facility] in [place] on [address] due to [facility] not being able to get her scheduled until 12/15/2018.  Cb# is [phone].  Please advise.  Thank You.

## 2018-11-30 ENCOUNTER — Telehealth: Payer: Self-pay | Admitting: Orthopaedic Surgery

## 2018-11-30 NOTE — Telephone Encounter (Signed)
[facility] is aware this has been faxed

## 2018-11-30 NOTE — Telephone Encounter (Signed)
Received call from [facility] needing order faxed over to her. Patient is having an MRI. The fax# is [phone]  The phone # is [phone]

## 2018-12-03 DIAGNOSIS — S83242A Other tear of medial meniscus, current injury, left knee, initial encounter: Secondary | ICD-10-CM | POA: Diagnosis not present

## 2018-12-03 DIAGNOSIS — M1712 Unilateral primary osteoarthritis, left knee: Secondary | ICD-10-CM | POA: Diagnosis not present

## 2018-12-03 DIAGNOSIS — R600 Localized edema: Secondary | ICD-10-CM | POA: Diagnosis not present

## 2018-12-03 DIAGNOSIS — S82145A Nondisplaced bicondylar fracture of left tibia, initial encounter for closed fracture: Secondary | ICD-10-CM | POA: Diagnosis not present

## 2018-12-06 ENCOUNTER — Ambulatory Visit (INDEPENDENT_AMBULATORY_CARE_PROVIDER_SITE_OTHER): Payer: BC Managed Care – PPO | Admitting: Orthopaedic Surgery

## 2018-12-06 ENCOUNTER — Encounter: Payer: Self-pay | Admitting: Orthopaedic Surgery

## 2018-12-06 ENCOUNTER — Other Ambulatory Visit: Payer: Self-pay

## 2018-12-06 DIAGNOSIS — M25562 Pain in left knee: Secondary | ICD-10-CM | POA: Diagnosis not present

## 2018-12-06 NOTE — Progress Notes (Signed)
The patient comes in today to go over an MRI of her left knee.  She has had a mechanical fall but really twisted her left knee significantly but more or less landed on the right side her right ankle.  She continued to have knee pain and some swelling so I did send her for an MRI because I was concerned about how her knee looked on my last exam and how she felt.  She is feeling better overall but still points to the lateral tibial plateau area as a source of the pain of her left knee.  She is walking without a limp and without assistive device.  On exam today her knee is ligamentously stable left knee with a mild effusion.  There is pain along the lateral joint line and lateral tibial plateau.  MRI is reviewed with her as well as the MRI report and it does show extensive edema in the lateral tibial plateau consistent with a nondisplaced fracture.  The rest the knee looks fine.  There is chronic tearing of the posterior horn of the medial meniscus but it appears chronic.  The collateral ligaments and ACL and PCL are both intact.  Her cartilage looks good in the knee in general especially on the lateral side.  The only thing that I would like her to do for now just avoid high impact aerobic activities and she understands this as well.  She took her MRI disc with her so she could show her significant other.  I am fine with her having this to show as well.  There is really no other restrictions for her because she knows that she does need to limit what she does with that knee.  I would like to see her back in 6 weeks with an AP and lateral of the left knee.  All question concerns were answered and addressed.

## 2018-12-15 ENCOUNTER — Other Ambulatory Visit: Payer: BC Managed Care – PPO

## 2019-01-17 ENCOUNTER — Encounter: Payer: Self-pay | Admitting: Orthopaedic Surgery

## 2019-01-17 ENCOUNTER — Ambulatory Visit: Payer: Self-pay

## 2019-01-17 ENCOUNTER — Ambulatory Visit (INDEPENDENT_AMBULATORY_CARE_PROVIDER_SITE_OTHER): Payer: BC Managed Care – PPO | Admitting: Orthopaedic Surgery

## 2019-01-17 DIAGNOSIS — M25562 Pain in left knee: Secondary | ICD-10-CM | POA: Diagnosis not present

## 2019-01-17 NOTE — Progress Notes (Signed)
The patient is now about 9 weeks after twisting injury to her left knee where she stepped wrong.  An MRI of her knee was obtained and did show significant edema in the lateral tibial plateau suggesting a significant stress fracture.  There is a fracture line visible.  He has been weightbearing as tolerated and only reports medial joint line tenderness.  The MRI of her knee did show some medial compartment arthritis and a chronic posterior horn/root meniscal tear.  She had been asymptomatic from that previous.  On exam she still has medial joint line tenderness does extend to the posterior medial aspect of the knee.  The lateral knee has only just slight tenderness palpation over the lateral tibial plateau.  There is no significant effusion in her knee is ligamentously stable good range of motion.  We will continue just to watch her conservatively for now.  We may consider arthroscopic intervention due to her medial compartment arthritis and meniscal tear if it becomes more symptomatic.  We would still hold off on an arthroscopic intervention until she is at least 3 months out from this nondisplaced tibial plateau fracture due to the pressure that arthroscopic surgery can doing a compartment of the knee and she understands this.  All question concerns were answered addressed.  We will see her back in 4 weeks for repeat exam but no x-rays are needed.

## 2019-02-13 ENCOUNTER — Ambulatory Visit (INDEPENDENT_AMBULATORY_CARE_PROVIDER_SITE_OTHER): Payer: BC Managed Care – PPO | Admitting: Orthopaedic Surgery

## 2019-02-13 ENCOUNTER — Encounter: Payer: Self-pay | Admitting: Orthopaedic Surgery

## 2019-02-13 DIAGNOSIS — M25562 Pain in left knee: Secondary | ICD-10-CM

## 2019-02-13 DIAGNOSIS — G8929 Other chronic pain: Secondary | ICD-10-CM | POA: Diagnosis not present

## 2019-02-13 DIAGNOSIS — Z96641 Presence of right artificial hip joint: Secondary | ICD-10-CM | POA: Diagnosis not present

## 2019-02-13 NOTE — Progress Notes (Signed)
The patient comes in today for continued follow-up of her left knee.  She sustained a stress fracture of the lateral tibial plateau about 3 months ago.  She still has some soreness with that knee on the lateral aspect.  An MRI confirmed a stress fracture.  There was also some degenerative changes in the medial compartment and a small medial meniscal tear but she has not been too symptomatic on the medial side of the knee.  She is also over a year out from a right total hip arthroplasty.  She still has some persistent numbness in the lateral femoral cutaneous nerve distribution but overall is doing well.  She is very active 53 years old.  I did let her know to avoid high impact aerobic activities still with her left knee.  On examination her left knee only hurts on the lateral tibial plateau to palpation.  The range of motion is good and there is no effusion.  The knee feels ligamentously stable.  Her right hip moves smoothly.  There is some subjective numbness in the lateral femoral cutaneous nerve distribution.  She says it is only mild today.  This point we will not need to see her back for 3 months.  At that visit I would like a standing low AP pelvis and a lateral of her right hip as well as an AP and lateral of her left knee.  All question concerns were otherwise answered and addressed.

## 2019-03-21 ENCOUNTER — Encounter: Payer: Self-pay | Admitting: Family Medicine

## 2019-03-21 ENCOUNTER — Other Ambulatory Visit: Payer: Self-pay

## 2019-03-21 ENCOUNTER — Ambulatory Visit: Payer: BC Managed Care – PPO | Admitting: Family Medicine

## 2019-03-21 VITALS — BP 116/80 | HR 80 | Temp 96.6°F | Wt 259.0 lb

## 2019-03-21 DIAGNOSIS — F4322 Adjustment disorder with anxiety: Secondary | ICD-10-CM | POA: Diagnosis not present

## 2019-03-21 DIAGNOSIS — L03011 Cellulitis of right finger: Secondary | ICD-10-CM

## 2019-03-21 MED ORDER — CITALOPRAM HYDROBROMIDE 40 MG PO TABS: 40.0000 mg | ORAL_TABLET | Freq: Every day | ORAL | 3 refills | Status: DC

## 2019-03-21 MED ORDER — SULFAMETHOXAZOLE-TRIMETHOPRIM 800-160 MG PO TABS: 1.0000 | ORAL_TABLET | Freq: Two times a day (BID) | ORAL | 0 refills | Status: AC

## 2019-03-21 NOTE — Progress Notes (Signed)
[name] Female    DOB: Nov 21, 1965   53 y.o.   MRN: [id number] Visit Date: 03/21/2019  Today's Provider: [name], MD   Chief Complaint  Patient presents with  . Wound Infection    Right index finger   Subjective:     Hand Pain  The pain is present in the right fingers (Right index finger). The quality of the pain is described as aching. Nothing aggravates the symptoms.   Anxiety is well controlled.  She sometimes breaks her celexa and does not take full 40mg  daily. Not taking Xanax hardly at all.  Feels better knowing that she has Celexa.  GAD 7 : Generalized Anxiety Score 03/21/2019  Nervous, Anxious, on Edge 1  Control/stop worrying 1  Worry too much - different things 0  Trouble relaxing 0  Restless 0  Easily annoyed or irritable 3  Afraid - awful might happen 0  Total GAD 7 Score 5  Anxiety Difficulty Not difficult at all    Depression screen [facility] 2/9 03/21/2019 07/09/2018 07/05/2017 05/08/2017 08/14/2015  Decreased Interest 1 2 3  0 0  Down, Depressed, Hopeless 0 0 3 0 0  PHQ - 2 Score 1 2 6  0 0  Altered sleeping 3 3 3 3  -  Tired, decreased energy 2 3 3 3  -  Change in appetite 2 2 2 3  -  Feeling bad or failure about yourself  0 0 0 0 -  Trouble concentrating 0 1 2 0 -  Moving slowly or fidgety/restless 0 0 1 0 -  Suicidal thoughts 0 0 0 0 -  PHQ-9 Score 8 11 17 9  -  Difficult doing work/chores Not difficult at all Not difficult at all Somewhat difficult Not difficult at all -      Allergies  Allergen Reactions  . Nexium [Esomeprazole Magnesium] Hives and Shortness Of Breath     Current Outpatient Medications:  .  citalopram (CELEXA) 40 MG tablet, Take 1 tablet (40 mg total) by mouth daily., Disp: 90 tablet, Rfl: 3 .  fluticasone (FLONASE) 50 MCG/ACT nasal spray, Place 2 sprays into both nostrils daily., Disp: 16 g, Rfl: 6 .  meclizine (ANTIVERT) 25 MG tablet, Take 1 tablet (25 mg total) by mouth 3 (three) times daily as needed  for dizziness., Disp: 90 tablet, Rfl: 0 .  meloxicam (MOBIC) 15 MG tablet, TAKE 1 TABLET BY MOUTH EVERY DAY (Patient not taking: Reported on 03/21/2019), Disp: 30 tablet, Rfl: 5 .  sulfamethoxazole-trimethoprim (BACTRIM DS) 800-160 MG tablet, Take 1 tablet by mouth 2 (two) times daily for 7 days., Disp: 14 tablet, Rfl: 0  Review of Systems  Constitutional: Negative.   HENT: Negative.   Respiratory: Negative.   Cardiovascular: Negative.   Musculoskeletal: Negative.   Skin: Positive for color change and wound.  Neurological: Negative.   Psychiatric/Behavioral: Negative.     Social History   Tobacco Use  . Smoking status: Former Smoker    Packs/day: 0.50    Years: 30.00    Pack years: 15.00    Types: Cigarettes    Quit date: 09/22/2017    Years since quitting: 1.4  . Smokeless tobacco: Never Used  Substance Use Topics  . Alcohol use: Yes    Comment: 1 beer weekly; occasionally       Objective:   BP 116/80 (BP Location: Right Arm, Patient Position: Sitting, Cuff Size: Large)   Pulse 80   Temp (!) 96.6 F (35.9 C) (Temporal)  Wt 259 lb (117.5 kg)   BMI 41.80 kg/m  Vitals:   03/21/19 0827  BP: 116/80  Pulse: 80  Temp: (!) 96.6 F (35.9 C)  TempSrc: Temporal  Weight: 259 lb (117.5 kg)  Body mass index is 41.8 kg/m.   Physical Exam Vitals signs reviewed.  Constitutional:      General: She is not in acute distress.    Appearance: Normal appearance. She is not diaphoretic.  HENT:     Head: Normocephalic and atraumatic.  Eyes:     General: No scleral icterus.    Conjunctiva/sclera: Conjunctivae normal.  Cardiovascular:     Rate and Rhythm: Normal rate and regular rhythm.     Pulses: Normal pulses.     Heart sounds: Normal heart sounds. No murmur.  Pulmonary:     Effort: Pulmonary effort is normal. No respiratory distress.     Breath sounds: Normal breath sounds. No wheezing.  Musculoskeletal:     Right lower leg: No edema.     Left lower leg: No edema.   Skin:    General: Skin is warm and dry.     Comments: Paronychia of R index finger  Neurological:     Mental Status: She is alert and oriented to person, place, and time. Mental status is at baseline.  Psychiatric:        Mood and Affect: Mood normal.        Behavior: Behavior normal.    No results found for any visits on 03/21/19.     Assessment & Plan   Problem List Items Addressed This Visit      Other   Adaptation reaction    Well-controlled Celexa is working well and she has cut back some on the dose She is taking Xanax very infrequently No changes today       Other Visit Diagnoses    Paronychia of finger of right hand    -  Primary   Relevant Medications   sulfamethoxazole-trimethoprim (BACTRIM DS) 800-160 MG tablet    -New problem -Exam is consistent with paronychia -No fluctuance to suggest it would be amenable to I&D today -We will start 7-day course of Bactrim -Discussed strict return precautions -She will call in 3 to 4 days if this is not improving and we will consider I&D at that point   Return if symptoms worsen or fail to improve.   The entirety of the information documented in the History of Present Illness, Review of Systems and Physical Exam were personally obtained by me. Portions of this information were initially documented by Ashley Royalty, CMA and reviewed by me for thoroughness and accuracy.    Bacigalupo, Dionne Bucy, MD MPH [facility]

## 2019-03-21 NOTE — Patient Instructions (Signed)
Paronychia Paronychia is an infection of the skin. It happens near a fingernail or toenail. It may cause pain and swelling around the nail. In some cases, a fluid-filled bump (abscess) can form near or under the nail. Usually, this condition is not serious, and it clears up with treatment. Follow these instructions at home: Wound care  Keep the affected area clean.  Soak the fingers or toes in warm water as told by your doctor. You may be told to do this for 20 minutes, 2-3 times a day.  Keep the area dry when you are not soaking it.  Do not try to drain a fluid-filled bump on your own.  Follow instructions from your doctor about how to take care of the affected area. Make sure you: ? Wash your hands with soap and water before you change your bandage (dressing). If you cannot use soap and water, use hand sanitizer. ? Change your bandage as told by your doctor.  If you had a fluid-filled bump and your doctor drained it, check the area every day for signs of infection. Check for: ? Redness, swelling, or pain. ? Fluid or blood. ? Warmth. ? Pus or a bad smell. Medicines   Take over-the-counter and prescription medicines only as told by your doctor.  If you were prescribed an antibiotic medicine, take it as told by your doctor. Do not stop taking it even if you start to feel better. General instructions  Avoid touching any chemicals.  Do not pick at the affected area. Prevention  To prevent this condition from happening again: ? Wear rubber gloves when putting your hands in water for washing dishes or other tasks. ? Wear gloves if your hands might touch cleaners or chemicals. ? Avoid injuring your nails or fingertips. ? Do not bite your nails or tear hangnails. ? Do not cut your nails very short. ? Do not cut the skin at the base and sides of the nail (cuticles). ? Use clean nail clippers or scissors when trimming nails. Contact a doctor if:  You feel worse.  You do not get  better.  You have more fluid, blood, or pus coming from the affected area.  Your finger or knuckle is swollen or is hard to move. Get help right away if you have:  A fever or chills.  Redness spreading from the affected area.  Pain in a joint or muscle. Summary  Paronychia is an infection of the skin. It happens near a fingernail or toenail.  This condition may cause pain and swelling around the nail.  Soak the fingers or toes in warm water as told by your doctor.  Usually, this condition is not serious, and it clears up with treatment. This information is not intended to replace advice given to you by your health care provider. Make sure you discuss any questions you have with your health care provider. Document Released: 04/27/2009 Document Revised: 05/26/2017 Document Reviewed: 05/22/2017 Elsevier Patient Education  2020 Elsevier Inc.  

## 2019-03-21 NOTE — Assessment & Plan Note (Signed)
Well-controlled Celexa is working well and she has cut back some on the dose She is taking Xanax very infrequently No changes today

## 2019-03-22 ENCOUNTER — Telehealth: Payer: Self-pay | Admitting: Family Medicine

## 2019-03-22 MED ORDER — DOXYCYCLINE HYCLATE 100 MG PO TABS: 100.0000 mg | ORAL_TABLET | Freq: Two times a day (BID) | ORAL | 0 refills | Status: DC

## 2019-03-22 NOTE — Telephone Encounter (Signed)
Pt advised.   Thanks,   -Laura  

## 2019-03-22 NOTE — Telephone Encounter (Signed)
Doxycycline 100mg  BID x7 days - ok to send eRx #[id number] [id number]

## 2019-03-22 NOTE — Telephone Encounter (Signed)
Pt states after taking Bactrim she started having headaches, body aches, redness in her face, and hot flashes.  Pt denies shortness of breath, fevers, nausea.  She would like to try a different antibiotic.  [facility] send to [facility].  I also advised her not to take anymore Bactrim until she hears from [place].   Thanks,   -Mickel Baas

## 2019-03-22 NOTE — Telephone Encounter (Signed)
Pt needing a call back regarding  sulfamethoxazole-trimethoprim (BACTRIM DS) 800-160 MG tablet  She thinks she may be allergic to it.   Pt woke up this morning feeling flushed and achy like the flu after taking the medication.   Please call pt back at [phone].  Thanks, [employer]

## 2019-05-20 ENCOUNTER — Other Ambulatory Visit: Payer: Self-pay

## 2019-05-20 ENCOUNTER — Ambulatory Visit: Payer: Self-pay

## 2019-05-20 ENCOUNTER — Ambulatory Visit: Payer: BC Managed Care – PPO | Admitting: Orthopaedic Surgery

## 2019-05-20 ENCOUNTER — Encounter: Payer: Self-pay | Admitting: Orthopaedic Surgery

## 2019-05-20 DIAGNOSIS — G8929 Other chronic pain: Secondary | ICD-10-CM

## 2019-05-20 DIAGNOSIS — Z96641 Presence of right artificial hip joint: Secondary | ICD-10-CM

## 2019-05-20 DIAGNOSIS — M5442 Lumbago with sciatica, left side: Secondary | ICD-10-CM

## 2019-05-20 DIAGNOSIS — M4807 Spinal stenosis, lumbosacral region: Secondary | ICD-10-CM

## 2019-05-20 DIAGNOSIS — M5441 Lumbago with sciatica, right side: Secondary | ICD-10-CM

## 2019-05-20 DIAGNOSIS — M25562 Pain in left knee: Secondary | ICD-10-CM

## 2019-05-20 MED ORDER — METHYLPREDNISOLONE 4 MG PO TABS: ORAL_TABLET | ORAL | 0 refills | Status: DC

## 2019-05-20 MED ORDER — KETOROLAC TROMETHAMINE 10 MG PO TABS: 10.0000 mg | ORAL_TABLET | Freq: Four times a day (QID) | ORAL | 0 refills | Status: DC | PRN

## 2019-05-20 MED ORDER — GABAPENTIN 300 MG PO CAPS: 300.0000 mg | ORAL_CAPSULE | Freq: Every day | ORAL | 1 refills | Status: DC
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
## 2019-05-21 ENCOUNTER — Telehealth: Payer: Self-pay | Admitting: Orthopaedic Surgery

## 2019-05-21 NOTE — Telephone Encounter (Signed)
Can you call her and give her this message? (I'm at the temp station)

## 2019-05-21 NOTE — Telephone Encounter (Signed)
I was more concerned about her lumbar spine.  We cannot order an MRI of the cervical and thoracic spines without actually seeing her for those and documenting anything about those aspects of her spine as well as obtaining plain films of her cervical and thoracic spines.  Let her know that.  That is for insurance purposes as well because an MRI of the entire spine is hard to get.  Again I am focusing more on the lumbar spine but if she needs the other aspects of her spine evaluated would have to see her in the office first to document that and obtain x-rays.

## 2019-05-21 NOTE — Telephone Encounter (Signed)
Advised patient. She understood.

## 2019-05-21 NOTE — Telephone Encounter (Signed)
Patient called. She was sent to have an MRI done on the 13th and realized it only covers the lumbar. She wants the entire spine evaluated and would like to hear from someone to further discuss it.   Call back number: [phone]

## 2019-05-29 ENCOUNTER — Ambulatory Visit (INDEPENDENT_AMBULATORY_CARE_PROVIDER_SITE_OTHER): Payer: BC Managed Care – PPO

## 2019-05-29 ENCOUNTER — Other Ambulatory Visit: Payer: Self-pay

## 2019-05-29 ENCOUNTER — Encounter: Payer: Self-pay | Admitting: Podiatry

## 2019-05-29 ENCOUNTER — Ambulatory Visit: Payer: BC Managed Care – PPO | Admitting: Podiatry

## 2019-05-29 VITALS — BP 139/73 | HR 102 | Resp 16

## 2019-05-29 DIAGNOSIS — M7661 Achilles tendinitis, right leg: Secondary | ICD-10-CM | POA: Diagnosis not present

## 2019-05-29 DIAGNOSIS — M722 Plantar fascial fibromatosis: Secondary | ICD-10-CM

## 2019-05-29 DIAGNOSIS — M7662 Achilles tendinitis, left leg: Secondary | ICD-10-CM | POA: Diagnosis not present

## 2019-05-29 DIAGNOSIS — M5136 Other intervertebral disc degeneration, lumbar region: Secondary | ICD-10-CM | POA: Insufficient documentation

## 2019-05-29 MED ORDER — CELECOXIB 100 MG PO CAPS: 100.0000 mg | ORAL_CAPSULE | Freq: Two times a day (BID) | ORAL | 1 refills | Status: DC

## 2019-05-29 NOTE — Patient Instructions (Signed)
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
## 2019-06-05 ENCOUNTER — Ambulatory Visit
Admission: RE | Admit: 2019-06-05 | Discharge: 2019-06-05 | Disposition: A | Discharge status: Home / Self Care | Payer: BC Managed Care – PPO | Source: Ambulatory Visit | Attending: Orthopaedic Surgery | Admitting: Orthopaedic Surgery

## 2019-06-05 ENCOUNTER — Other Ambulatory Visit: Payer: Self-pay

## 2019-06-05 DIAGNOSIS — M4807 Spinal stenosis, lumbosacral region: Secondary | ICD-10-CM

## 2019-06-05 DIAGNOSIS — M48061 Spinal stenosis, lumbar region without neurogenic claudication: Secondary | ICD-10-CM | POA: Diagnosis not present

## 2019-06-07 ENCOUNTER — Other Ambulatory Visit: Payer: Self-pay

## 2019-06-07 ENCOUNTER — Encounter: Payer: Self-pay | Admitting: Family Medicine

## 2019-06-07 MED ORDER — TRAZODONE HCL 150 MG PO TABS: 150.0000 mg | ORAL_TABLET | Freq: Every day | ORAL | 0 refills | Status: DC

## 2019-06-07 NOTE — Telephone Encounter (Signed)
Ok to send refill  

## 2019-06-07 NOTE — Telephone Encounter (Signed)
Trazodone 150 was last ordered in 2016.  Heather Crews, MD to [facility] Nurse     06/07/19 4:38 PM Note   Ok to send refill      I need to get e new prescription for trazodone please.  [facility] [address] for [facility]ill and they said they haven't heard anything back.

## 2019-06-17 ENCOUNTER — Other Ambulatory Visit: Payer: Self-pay

## 2019-06-17 ENCOUNTER — Encounter: Payer: Self-pay | Admitting: Orthopaedic Surgery

## 2019-06-17 ENCOUNTER — Ambulatory Visit: Payer: BC Managed Care – PPO | Admitting: Orthopaedic Surgery

## 2019-06-17 DIAGNOSIS — G8929 Other chronic pain: Secondary | ICD-10-CM | POA: Diagnosis not present

## 2019-06-17 DIAGNOSIS — M4807 Spinal stenosis, lumbosacral region: Secondary | ICD-10-CM

## 2019-06-17 DIAGNOSIS — M5441 Lumbago with sciatica, right side: Secondary | ICD-10-CM | POA: Diagnosis not present

## 2019-06-17 DIAGNOSIS — M5442 Lumbago with sciatica, left side: Secondary | ICD-10-CM

## 2019-06-17 MED ORDER — GABAPENTIN 300 MG PO CAPS: 300.0000 mg | ORAL_CAPSULE | Freq: Two times a day (BID) | ORAL | 1 refills | Status: DC

## 2019-06-17 NOTE — Progress Notes (Signed)
The patient is coming in to go over an MRI of her lumbar spine.  She is still getting numbness and tingling down both her legs.  It is worse with mechanical types of activities.  She does take Neurontin 300 mg just at bedtime.  She denies any change in bowel or bladder function or weakness in her legs.  10 years ago when she had a previous MRI she went through a series of spine injections that did not really help her quite as much as she wanted.  She has been through therapy as well.  She does have a history of a right total hip arthroplasty and some left knee issues.  She denies any other acute changes in her medical status.  On exam she has negative straight leg raise bilaterally and good strength in her lower extremities.  MRIs reviewed with her and does show facet disease at several levels with some shallow disc bulges.  There is no severe stenosis at any levels.  At L4-L5 there is a small shallow disc bulge to the left side but does not appear to be contacting the nerve.  I did share with her the MRI findings.  I do feel that she benefit from continued weight loss and core strengthening exercises and even formal physical therapy again.  I would like her to go up on her Neurontin to 300 mg twice a day and if she tolerates that well after a week she can increase this to 3 times a day.  With that change in medications we will see her back in 4 weeks to see how she is doing overall.  All question concerns were answered and addressed.

## 2019-07-01 ENCOUNTER — Other Ambulatory Visit: Payer: Self-pay | Admitting: Family Medicine

## 2019-07-10 ENCOUNTER — Other Ambulatory Visit: Payer: Self-pay

## 2019-07-10 ENCOUNTER — Encounter: Payer: Self-pay | Admitting: Podiatry

## 2019-07-10 ENCOUNTER — Telehealth: Payer: Self-pay

## 2019-07-10 ENCOUNTER — Ambulatory Visit: Payer: BC Managed Care – PPO | Admitting: Podiatry

## 2019-07-10 DIAGNOSIS — M7661 Achilles tendinitis, right leg: Secondary | ICD-10-CM

## 2019-07-10 DIAGNOSIS — M76821 Posterior tibial tendinitis, right leg: Secondary | ICD-10-CM

## 2019-07-10 NOTE — Telephone Encounter (Signed)
Spoke with patient- she would like to reschedule her CPE- call to office for assistance. ( Computer will not let me schedule that appointment type.)

## 2019-07-10 NOTE — Telephone Encounter (Signed)
Copied from [place] [id number]. Topic: General - Other >> Jul 10, 2019  8:21 AM Celene Kras wrote: Reason for CRM: Pt called and is requesting to know if her physical for 2/18 should be rescheduled or if it is okay for her physical to be virtual due to weather. Please advise.

## 2019-07-10 NOTE — Progress Notes (Signed)
Presents today for follow-up of her tendinitis.  States that the left heel is doing just fine as the right foot and heel that are throbbing.  Objective: Vital signs are stable she is alert and oriented x3 has pain on palpation just beneath the level of the posterior tibial tendon near the medial malleolus.  She also has pain on palpation to the posterior superior lateral aspect of the calcaneus right heel.  Assessment: Probable neuritis or posterior tibial tendinitis right.  With bursitis insertional Achilles tendinitis right.  Plan: Injected dexamethasone subcutaneously today point of maximal tenderness of the right heel I also injected 10 mg Kenalog 5 mg of Marcaine medial aspect of the right foot.  She will continue her oral medication braces and shoe gear.  Follow-up with her in 1 month.  May need to consider orthotics.

## 2019-07-10 NOTE — Telephone Encounter (Signed)
Per Dr. B patient can reschedule CPE. LMTCB to reschedule. Okay for PEC to schedule appointment.

## 2019-07-11 ENCOUNTER — Encounter: Payer: 59 | Admitting: Family Medicine

## 2019-07-15 ENCOUNTER — Ambulatory Visit: Payer: BC Managed Care – PPO | Admitting: Orthopaedic Surgery

## 2019-07-22 ENCOUNTER — Encounter: Payer: Self-pay | Admitting: Orthopaedic Surgery

## 2019-07-22 ENCOUNTER — Other Ambulatory Visit: Payer: Self-pay

## 2019-07-22 ENCOUNTER — Ambulatory Visit: Payer: BC Managed Care – PPO | Admitting: Orthopaedic Surgery

## 2019-07-22 DIAGNOSIS — M5442 Lumbago with sciatica, left side: Secondary | ICD-10-CM

## 2019-07-22 DIAGNOSIS — M4807 Spinal stenosis, lumbosacral region: Secondary | ICD-10-CM

## 2019-07-22 DIAGNOSIS — G8929 Other chronic pain: Secondary | ICD-10-CM | POA: Diagnosis not present

## 2019-07-22 DIAGNOSIS — M5441 Lumbago with sciatica, right side: Secondary | ICD-10-CM

## 2019-07-22 NOTE — Progress Notes (Signed)
Heather Orr comes in today with continued left sided sciatica with low back pain that radiates into her sciatic region all the way down to her foot.  She has had numerous injections in her spine before.  It has become frustrating for her.  She does take gabapentin at night.  She still cannot lift her leg comfortably getting in and out of a car.  Her left hip pain radiates into the buttocks area but not in the groin itself.  On exam she continues to have a positive straight leg raise on the left side with subjective numbness in the lateral aspect of her left leg.  She has low back pain that radiates into the left side on palpation of the facet joints as well.  Her previous MRI does show facet disease at that level and a left-sided disc protrusion at L4-L5.  This disc protrusion L4-L5 continues to be symptomatic.  I would like to send her to neurosurgery as a referral for further ration treatment to see what their insights would be as to whether or not she would benefit from an intervention on her spine.  She agrees with this referral.  All question concerns were answered and addressed.

## 2019-07-30 DIAGNOSIS — M48061 Spinal stenosis, lumbar region without neurogenic claudication: Secondary | ICD-10-CM | POA: Diagnosis not present

## 2019-07-30 DIAGNOSIS — M5440 Lumbago with sciatica, unspecified side: Secondary | ICD-10-CM | POA: Diagnosis not present

## 2019-07-30 DIAGNOSIS — R030 Elevated blood-pressure reading, without diagnosis of hypertension: Secondary | ICD-10-CM | POA: Insufficient documentation

## 2019-07-30 DIAGNOSIS — Z6841 Body Mass Index (BMI) 40.0 and over, adult: Secondary | ICD-10-CM | POA: Insufficient documentation

## 2019-08-14 ENCOUNTER — Ambulatory Visit: Payer: BC Managed Care – PPO | Admitting: Podiatry

## 2019-08-27 DIAGNOSIS — M47816 Spondylosis without myelopathy or radiculopathy, lumbar region: Secondary | ICD-10-CM | POA: Diagnosis not present
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
## 2019-09-09 ENCOUNTER — Ambulatory Visit (INDEPENDENT_AMBULATORY_CARE_PROVIDER_SITE_OTHER): Payer: BC Managed Care – PPO | Admitting: Family Medicine

## 2019-09-09 ENCOUNTER — Other Ambulatory Visit: Payer: Self-pay

## 2019-09-09 ENCOUNTER — Encounter: Payer: Self-pay | Admitting: Family Medicine

## 2019-09-09 VITALS — BP 121/78 | HR 61 | Temp 96.8°F | Ht 66.0 in | Wt 251.0 lb

## 2019-09-09 DIAGNOSIS — Z1231 Encounter for screening mammogram for malignant neoplasm of breast: Secondary | ICD-10-CM | POA: Diagnosis not present

## 2019-09-09 DIAGNOSIS — E7800 Pure hypercholesterolemia, unspecified: Secondary | ICD-10-CM

## 2019-09-09 DIAGNOSIS — Z23 Encounter for immunization: Secondary | ICD-10-CM | POA: Diagnosis not present

## 2019-09-09 DIAGNOSIS — Z0000 Encounter for general adult medical examination without abnormal findings: Secondary | ICD-10-CM

## 2019-09-09 DIAGNOSIS — L819 Disorder of pigmentation, unspecified: Secondary | ICD-10-CM

## 2019-09-09 NOTE — Assessment & Plan Note (Addendum)
Discussed importance of healthy weight management Discussed diet and exercise  

## 2019-09-09 NOTE — Assessment & Plan Note (Signed)
Reviewed last lipid panel Not currently on a statin Recheck FLP and CMP Discussed diet and exercise  

## 2019-09-09 NOTE — Patient Instructions (Signed)

## 2019-09-09 NOTE — Assessment & Plan Note (Signed)
Recently noted problem, but present for some time Concern for possible hemochromatosis Will check CBC and iron panel

## 2019-09-10 ENCOUNTER — Ambulatory Visit: Payer: Self-pay

## 2019-09-10 LAB — CBC WITH DIFFERENTIAL/PLATELET
Basophils Absolute: 0 10*3/uL (ref 0.0–0.2)
Basos: 0 %
EOS (ABSOLUTE): 0.1 10*3/uL (ref 0.0–0.4)
Eos: 1 %
Hematocrit: 47 % — ABNORMAL HIGH (ref 34.0–46.6)
Hemoglobin: 15.9 g/dL (ref 11.1–15.9)
Immature Grans (Abs): 0 10*3/uL (ref 0.0–0.1)
Immature Granulocytes: 0 %
Lymphocytes Absolute: 4.6 10*3/uL — ABNORMAL HIGH (ref 0.7–3.1)
Lymphs: 41 %
MCH: 30.1 pg (ref 26.6–33.0)
MCHC: 33.8 g/dL (ref 31.5–35.7)
MCV: 89 fL (ref 79–97)
Monocytes Absolute: 0.5 10*3/uL (ref 0.1–0.9)
Monocytes: 5 %
Neutrophils Absolute: 6 10*3/uL (ref 1.4–7.0)
Neutrophils: 53 %
Platelets: 411 10*3/uL (ref 150–450)
RBC: 5.28 x10E6/uL (ref 3.77–5.28)
RDW: 12.3 % (ref 11.7–15.4)
WBC: 11.3 10*3/uL — ABNORMAL HIGH (ref 3.4–10.8)

## 2019-09-10 LAB — COMPREHENSIVE METABOLIC PANEL
ALT: 26 IU/L (ref 0–32)
AST: 18 IU/L (ref 0–40)
Albumin/Globulin Ratio: 1.9 (ref 1.2–2.2)
Albumin: 4.3 g/dL (ref 3.8–4.9)
Alkaline Phosphatase: 95 IU/L (ref 39–117)
BUN/Creatinine Ratio: 18 (ref 9–23)
BUN: 12 mg/dL (ref 6–24)
Bilirubin Total: 0.5 mg/dL (ref 0.0–1.2)
CO2: 21 mmol/L (ref 20–29)
Calcium: 9.6 mg/dL (ref 8.7–10.2)
Chloride: 104 mmol/L (ref 96–106)
Creatinine, Ser: 0.67 mg/dL (ref 0.57–1.00)
GFR calc Af Amer: 115 mL/min/{1.73_m2} (ref >59)
GFR calc non Af Amer: 100 mL/min/{1.73_m2} (ref >59)
Globulin, Total: 2.3 g/dL (ref 1.5–4.5)
Glucose: 83 mg/dL (ref 65–99)
Potassium: 4.5 mmol/L (ref 3.5–5.2)
Sodium: 140 mmol/L (ref 134–144)
Total Protein: 6.6 g/dL (ref 6.0–8.5)

## 2019-09-10 LAB — LIPID PANEL
Chol/HDL Ratio: 3.4 ratio (ref 0.0–4.4)
Cholesterol, Total: 209 mg/dL — ABNORMAL HIGH (ref 100–199)
HDL: 62 mg/dL (ref >39)
LDL Chol Calc (NIH): 126 mg/dL — ABNORMAL HIGH (ref 0–99)
Triglycerides: 116 mg/dL (ref 0–149)
VLDL Cholesterol Cal: 21 mg/dL (ref 5–40)

## 2019-09-10 LAB — IRON,TIBC AND FERRITIN PANEL
Ferritin: 258 ng/mL — ABNORMAL HIGH (ref 15–150)
Iron Saturation: 40 % (ref 15–55)
Iron: 129 ug/dL (ref 27–159)
Total Iron Binding Capacity: 321 ug/dL (ref 250–450)
UIBC: 192 ug/dL (ref 131–425)

## 2019-09-10 LAB — TSH: TSH: 0.831 u[IU]/mL (ref 0.450–4.500)

## 2019-09-10 NOTE — Telephone Encounter (Signed)
Returned call to patient who states that she had the shingles vaccine yesterday and now has swelling and redness around the injection site. She states the redness and swelling  at the injection site and about the size of a half moon 50 cent piece.  She states that it is mildly sore.  She has no rashes, no HA, fever, chills. No difficulty breathing. Home care advice read to patient.  She verbalized understanding of all information.   Reason for Disposition . Injection site reaction to any vaccine  Answer Assessment - Initial Assessment Questions 1. SYMPTOMS: "What is the main symptom?" (e.g., redness, swelling, pain)      Swelling redness 2. ONSET: "When was the vaccine (shot) given?" "How much later did thesymptoms begin?" (e.g., hours, days ago)     Last night 9pm 3. SEVERITY: "How bad is it?"      Mild soreness feels warm in area 4. FEVER: "Is there a fever?" If so, ask: "What is it, how was it measured, and when did it start?"      no 5. IMMUNIZATIONS GIVEN: "What shots have you recently received?"    Shingles 6. PAST REACTIONS: "Have you reacted to immunizations before?" If so, ask: "What happened?"     no 7. OTHER SYMPTOMS: "Do you have any other symptoms?"    no  Protocols used: IMMUNIZATION REACTIONS-A-AH

## 2019-09-10 NOTE — Telephone Encounter (Signed)
That is a common reaction to Shingrix.  Ice and continue to monitor.  Ibuprofen or tylenol as needed for pain

## 2019-09-11 ENCOUNTER — Telehealth: Payer: Self-pay

## 2019-09-11 DIAGNOSIS — D72829 Elevated white blood cell count, unspecified: Secondary | ICD-10-CM

## 2019-09-11 NOTE — Telephone Encounter (Signed)
-----   Message from Virginia Crews, MD sent at 09/10/2019 11:54 AM EDT ----- Normal labs, except white blood cell count is slightly elevated.  This could be related to any recent infection.  Would like to recheck in 1 month.  Cholesterol is high and has increased over the last year, but 10-year risk of heart disease/stroke is low at 1.5%.  No need for medications at this time, but I recommend diet low in saturated fat and regular exercise - 30 min at least 5 times per week.  Iron level is normal.

## 2019-09-11 NOTE — Telephone Encounter (Signed)
Patient advised as below. Patient verbalizes understanding and is in agreement with treatment plan.  

## 2019-09-17 DIAGNOSIS — M5440 Lumbago with sciatica, unspecified side: Secondary | ICD-10-CM | POA: Diagnosis not present

## 2019-09-25 DIAGNOSIS — Z1231 Encounter for screening mammogram for malignant neoplasm of breast: Secondary | ICD-10-CM | POA: Diagnosis not present

## 2019-09-25 LAB — HM MAMMOGRAPHY

## 2019-09-30 ENCOUNTER — Encounter: Payer: Self-pay | Admitting: Family Medicine

## 2019-10-10 DIAGNOSIS — D72829 Elevated white blood cell count, unspecified: Secondary | ICD-10-CM | POA: Diagnosis not present

## 2019-10-11 ENCOUNTER — Telehealth: Payer: Self-pay

## 2019-10-11 LAB — CBC WITH DIFFERENTIAL/PLATELET
Basophils Absolute: 0 10*3/uL (ref 0.0–0.2)
Basos: 0 %
EOS (ABSOLUTE): 0.1 10*3/uL (ref 0.0–0.4)
Eos: 1 %
Hematocrit: 46.3 % (ref 34.0–46.6)
Hemoglobin: 15.5 g/dL (ref 11.1–15.9)
Immature Grans (Abs): 0 10*3/uL (ref 0.0–0.1)
Immature Granulocytes: 0 %
Lymphocytes Absolute: 3.8 10*3/uL — ABNORMAL HIGH (ref 0.7–3.1)
Lymphs: 50 %
MCH: 30.8 pg (ref 26.6–33.0)
MCHC: 33.5 g/dL (ref 31.5–35.7)
MCV: 92 fL (ref 79–97)
Monocytes Absolute: 0.6 10*3/uL (ref 0.1–0.9)
Monocytes: 8 %
Neutrophils Absolute: 3.2 10*3/uL (ref 1.4–7.0)
Neutrophils: 41 %
Platelets: 375 10*3/uL (ref 150–450)
RBC: 5.04 x10E6/uL (ref 3.77–5.28)
RDW: 12.7 % (ref 11.7–15.4)
WBC: 7.7 10*3/uL (ref 3.4–10.8)

## 2019-10-11 NOTE — Telephone Encounter (Signed)
-----   Message from Virginia Crews, MD sent at 10/11/2019  8:36 AM EDT ----- Blood counts are back to normal

## 2019-10-11 NOTE — Telephone Encounter (Signed)
[employer]   Result Notes and Comments to Patient Comment seen by patient Heather Orr on 10/11/2019 8:57 AM EDT

## 2019-11-27 ENCOUNTER — Other Ambulatory Visit: Payer: Self-pay

## 2019-11-27 ENCOUNTER — Ambulatory Visit: Payer: BC Managed Care – PPO | Admitting: Podiatry

## 2019-11-27 ENCOUNTER — Encounter: Payer: Self-pay | Admitting: Podiatry

## 2019-11-27 DIAGNOSIS — M7661 Achilles tendinitis, right leg: Secondary | ICD-10-CM | POA: Diagnosis not present

## 2019-11-27 MED ORDER — METHYLPREDNISOLONE 4 MG PO TBPK
ORAL_TABLET | ORAL | 0 refills | Status: DC
Start: 2019-11-27 — End: 2020-05-27

## 2019-11-27 NOTE — Progress Notes (Signed)
She presents today for follow-up of bursitis of the Achilles. States there is a knot on the back that really bothers me.  Objective: Vital signs are stable alert oriented x3. She has pain on palpation posterior aspect of the right heel with warmth and fluctuance. There is bursitis here that is present overlying the Achilles tendon which is overlying a retrocalcaneal heel spur.  Assessment: Retrocalcaneal heel spur with Achilles tendinitis and bursitis right.  Plan: Start her on a Medrol Dosepak and injected 5 mg of Kenalog into the bursa. She tolerated procedure well without complications. Follow-up with her in 1 month if necessary. May need to consider MRI at that point

## 2019-12-17 DIAGNOSIS — M47816 Spondylosis without myelopathy or radiculopathy, lumbar region: Secondary | ICD-10-CM | POA: Diagnosis not present

## 2019-12-17 DIAGNOSIS — R200 Anesthesia of skin: Secondary | ICD-10-CM | POA: Insufficient documentation

## 2019-12-31 DIAGNOSIS — H0015 Chalazion left lower eyelid: Secondary | ICD-10-CM | POA: Diagnosis not present

## 2020-01-01 ENCOUNTER — Ambulatory Visit: Payer: BC Managed Care – PPO | Admitting: Podiatry

## 2020-01-10 DIAGNOSIS — H0015 Chalazion left lower eyelid: Secondary | ICD-10-CM | POA: Diagnosis not present

## 2020-01-31 DIAGNOSIS — M5416 Radiculopathy, lumbar region: Secondary | ICD-10-CM | POA: Diagnosis not present

## 2020-01-31 DIAGNOSIS — R200 Anesthesia of skin: Secondary | ICD-10-CM | POA: Diagnosis not present

## 2020-02-11 DIAGNOSIS — M47816 Spondylosis without myelopathy or radiculopathy, lumbar region: Secondary | ICD-10-CM | POA: Diagnosis not present

## 2020-02-18 DIAGNOSIS — M47816 Spondylosis without myelopathy or radiculopathy, lumbar region: Secondary | ICD-10-CM | POA: Diagnosis not present

## 2020-02-26 ENCOUNTER — Encounter: Payer: Self-pay | Admitting: Physician Assistant

## 2020-02-26 ENCOUNTER — Other Ambulatory Visit: Payer: Self-pay

## 2020-02-26 ENCOUNTER — Ambulatory Visit: Payer: BC Managed Care – PPO | Admitting: Physician Assistant

## 2020-02-26 VITALS — BP 132/94 | HR 143 | Temp 99.2°F | Resp 16 | Ht 66.0 in | Wt 250.0 lb

## 2020-02-26 DIAGNOSIS — Z23 Encounter for immunization: Secondary | ICD-10-CM | POA: Diagnosis not present

## 2020-02-26 DIAGNOSIS — J301 Allergic rhinitis due to pollen: Secondary | ICD-10-CM

## 2020-02-26 DIAGNOSIS — R6889 Other general symptoms and signs: Secondary | ICD-10-CM

## 2020-02-26 DIAGNOSIS — K219 Gastro-esophageal reflux disease without esophagitis: Secondary | ICD-10-CM

## 2020-02-26 DIAGNOSIS — R0989 Other specified symptoms and signs involving the circulatory and respiratory systems: Secondary | ICD-10-CM

## 2020-02-26 MED ORDER — FAMOTIDINE 20 MG PO TABS: 20.0000 mg | ORAL_TABLET | Freq: Two times a day (BID) | ORAL | 0 refills | Status: DC

## 2020-02-26 NOTE — Progress Notes (Signed)
Established patient visit   [name]   DOB: Sep 30, 1965   54 y.o. Female  MRN: [id number] Visit Date: 02/26/2020  Today's healthcare provider: Trinna Post, PA-C   Chief Complaint  Patient presents with  . Gastroesophageal Reflux   Subjective    HPI  Patient presents today with a sensation of something feeling stuck in her throat. She reports that she has had symptoms X 2 weeks. She reports the pain happens intermittently over the past two weeks, but yesterday it was all day. Yesterday she ate a muffin and eggs, coffee, hamburger helper, and some sausage. Feels she needs to clear her throat. Denies vomiting, unintentional weight loss. She has some mild abdominal pain. She does have history of GERD and allergies. She has taking Alka Seltzer and Maalox with no relief. She is currently taking 2nd generation antihistamine, she takes 1/2 tablet twice daily. She is not using flonase currently. She has a reported allergy to Nexium, which is hives. She is not currently taking a medication like a pepcid.     Wt Readings from Last 3 Encounters:  02/26/20 250 lb (113.4 kg)  09/09/19 251 lb (113.9 kg)  03/21/19 259 lb (117.5 kg)       Medications: Outpatient Medications Prior to Visit  Medication Sig  . citalopram (CELEXA) 40 MG tablet Take 1 tablet (40 mg total) by mouth daily.  . fluticasone (FLONASE) 50 MCG/ACT nasal spray Place 2 sprays into both nostrils daily.  Marland Kitchen loratadine (CLARITIN) 10 MG tablet Take 10 mg by mouth daily.  . traZODone (DESYREL) 150 MG tablet TAKE 1 TABLET (150 MG TOTAL) BY MOUTH AT BEDTIME.  . methylPREDNISolone (MEDROL DOSEPAK) 4 MG TBPK tablet 6 day dose pack - take as directed   No facility-administered medications prior to visit.    Review of Systems    Objective    BP (!) 132/94   Pulse (!) 143   Temp 99.2 F (37.3 C)   Resp 16   Ht 5\' 6"  (1.676 m)   Wt 250 lb (113.4 kg)   BMI 40.35 kg/m    Physical Exam Constitutional:        Appearance: Normal appearance.  Neck:     Thyroid: No thyroid mass or thyromegaly.  Cardiovascular:     Rate and Rhythm: Normal rate and regular rhythm.     Heart sounds: Normal heart sounds.  Pulmonary:     Effort: Pulmonary effort is normal.     Breath sounds: Normal breath sounds.  Skin:    General: Skin is warm and dry.  Neurological:     Mental Status: She is alert and oriented to person, place, and time. Mental status is at baseline.  Psychiatric:        Mood and Affect: Mood normal.        Behavior: Behavior normal.       No results found for any visits on 02/26/20.  Assessment & Plan    1. Throat clearing  Suspect a combination of uncontrolled GERD and allergies.  2. Gastroesophageal reflux disease, unspecified whether esophagitis present  Discussed avoiding triggers, modest weight loss and adding pepcid.   - famotidine (PEPCID) 20 MG tablet; Take 1 tablet (20 mg total) by mouth 2 (two) times daily.  Dispense: 180 tablet; Refill: 0  3. Allergic rhinitis due to pollen, unspecified seasonality  Add flonase.   Return if symptoms worsen or fail to improve.      Nikki Dom  Terrilee Croak, PA-C, have reviewed all documentation for this visit. The documentation on 02/26/20 for the exam, diagnosis, procedures, and orders are all accurate and complete.  The entirety of the information documented in the History of Present Illness, Review of Systems and Physical Exam were personally obtained by me. Portions of this information were initially documented by Wilburt Finlay, CMA and reviewed by me for thoroughness and accuracy.     Paulene Floor  [facility] [phone] (phone) [phone] (fax)  [place]

## 2020-02-26 NOTE — Patient Instructions (Signed)

## 2020-02-28 DIAGNOSIS — M47816 Spondylosis without myelopathy or radiculopathy, lumbar region: Secondary | ICD-10-CM | POA: Diagnosis not present

## 2020-03-01 ENCOUNTER — Encounter: Payer: Self-pay | Admitting: Family Medicine

## 2020-03-02 MED ORDER — FLUTICASONE PROPIONATE 50 MCG/ACT NA SUSP: 2.0000 | Freq: Every day | NASAL | 6 refills | Status: DC

## 2020-03-19 DIAGNOSIS — M47816 Spondylosis without myelopathy or radiculopathy, lumbar region: Secondary | ICD-10-CM | POA: Diagnosis not present

## 2020-05-07 DIAGNOSIS — Z6841 Body Mass Index (BMI) 40.0 and over, adult: Secondary | ICD-10-CM | POA: Diagnosis not present

## 2020-05-07 DIAGNOSIS — M546 Pain in thoracic spine: Secondary | ICD-10-CM | POA: Diagnosis not present

## 2020-05-07 DIAGNOSIS — R030 Elevated blood-pressure reading, without diagnosis of hypertension: Secondary | ICD-10-CM | POA: Diagnosis not present

## 2020-05-08 ENCOUNTER — Other Ambulatory Visit: Payer: Self-pay | Admitting: Neurosurgery

## 2020-05-08 DIAGNOSIS — M546 Pain in thoracic spine: Secondary | ICD-10-CM

## 2020-05-25 ENCOUNTER — Ambulatory Visit: Payer: BC Managed Care – PPO

## 2020-05-27 ENCOUNTER — Ambulatory Visit: Payer: Self-pay

## 2020-05-27 ENCOUNTER — Encounter: Payer: Self-pay | Admitting: Physician Assistant

## 2020-05-27 ENCOUNTER — Ambulatory Visit: Payer: BC Managed Care – PPO | Admitting: Physician Assistant

## 2020-05-27 VITALS — Ht 66.0 in | Wt 250.0 lb

## 2020-05-27 DIAGNOSIS — M25561 Pain in right knee: Secondary | ICD-10-CM

## 2020-05-27 MED ORDER — METHYLPREDNISOLONE ACETATE 40 MG/ML IJ SUSP
40.0000 mg | INTRAMUSCULAR | Status: AC | PRN
  Administered 2020-05-27: 40 mg via INTRA_ARTICULAR

## 2020-05-27 MED ORDER — LIDOCAINE HCL 1 % IJ SOLN
3.0000 mL | INTRAMUSCULAR | Status: AC | PRN
  Administered 2020-05-27: 3 mL
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
## 2020-06-05 ENCOUNTER — Telehealth: Payer: Self-pay | Admitting: Orthopaedic Surgery

## 2020-06-05 ENCOUNTER — Other Ambulatory Visit: Payer: Self-pay

## 2020-06-05 DIAGNOSIS — M25561 Pain in right knee: Secondary | ICD-10-CM

## 2020-06-05 NOTE — Telephone Encounter (Signed)
Yes.  Heather Orr just saw her last.  He is recommended a MRI of the right knee to rule out a meniscal tear if conservative treatment failed including a steroid shot that she has had.  It sounds like conservative treatment is failed so it is okay to order an MRI of her right knee to rule out a meniscal tear.

## 2020-06-05 NOTE — Telephone Encounter (Signed)
Patient called requesting a return call. Patient states Dr. Ninfa Linden stated for patient to call with update after cortisone injection. Patient states her knee is still retaining fluid, can only bend 90 degree, injection only helped little. Patient also states Dr. Ninfa Linden states she will need MRI if pains persist. Patient also states does she need to keep upcoming appt or reschedule once MRI referral is sent. Patient phone number is [phone].

## 2020-06-05 NOTE — Telephone Encounter (Signed)
LMOM for patient that I sent in an order for to have an MRI

## 2020-06-10 ENCOUNTER — Ambulatory Visit: Payer: BC Managed Care – PPO | Admitting: Orthopaedic Surgery

## 2020-06-12 ENCOUNTER — Other Ambulatory Visit: Payer: Self-pay | Admitting: Neurosurgery

## 2020-06-12 DIAGNOSIS — M546 Pain in thoracic spine: Secondary | ICD-10-CM

## 2020-06-27 ENCOUNTER — Other Ambulatory Visit: Payer: Self-pay

## 2020-06-27 ENCOUNTER — Ambulatory Visit
Admission: RE | Admit: 2020-06-27 | Discharge: 2020-06-27 | Disposition: A | Discharge status: Home / Self Care | Payer: BC Managed Care – PPO | Source: Ambulatory Visit | Attending: Orthopaedic Surgery | Admitting: Orthopaedic Surgery

## 2020-06-27 ENCOUNTER — Ambulatory Visit
Admission: RE | Admit: 2020-06-27 | Discharge: 2020-06-27 | Disposition: A | Discharge status: Home / Self Care | Payer: BC Managed Care – PPO | Source: Ambulatory Visit | Attending: Neurosurgery | Admitting: Neurosurgery

## 2020-06-27 DIAGNOSIS — M40204 Unspecified kyphosis, thoracic region: Secondary | ICD-10-CM | POA: Diagnosis not present

## 2020-06-27 DIAGNOSIS — M546 Pain in thoracic spine: Secondary | ICD-10-CM

## 2020-06-27 DIAGNOSIS — M25561 Pain in right knee: Secondary | ICD-10-CM | POA: Diagnosis not present

## 2020-06-27 DIAGNOSIS — M47814 Spondylosis without myelopathy or radiculopathy, thoracic region: Secondary | ICD-10-CM | POA: Diagnosis not present

## 2020-07-09 DIAGNOSIS — Z6841 Body Mass Index (BMI) 40.0 and over, adult: Secondary | ICD-10-CM | POA: Diagnosis not present

## 2020-07-09 DIAGNOSIS — M47816 Spondylosis without myelopathy or radiculopathy, lumbar region: Secondary | ICD-10-CM | POA: Diagnosis not present

## 2020-07-09 DIAGNOSIS — M546 Pain in thoracic spine: Secondary | ICD-10-CM | POA: Diagnosis not present

## 2020-07-13 ENCOUNTER — Encounter: Payer: Self-pay | Admitting: Orthopaedic Surgery

## 2020-07-13 ENCOUNTER — Ambulatory Visit: Payer: BC Managed Care – PPO | Admitting: Orthopaedic Surgery

## 2020-07-13 DIAGNOSIS — M25562 Pain in left knee: Secondary | ICD-10-CM | POA: Diagnosis not present

## 2020-07-13 DIAGNOSIS — M25561 Pain in right knee: Secondary | ICD-10-CM

## 2020-07-13 DIAGNOSIS — S83231D Complex tear of medial meniscus, current injury, right knee, subsequent encounter: Secondary | ICD-10-CM

## 2020-07-13 DIAGNOSIS — G8929 Other chronic pain: Secondary | ICD-10-CM

## 2020-07-13 MED ORDER — METHYLPREDNISOLONE ACETATE 40 MG/ML IJ SUSP
40.0000 mg | INTRAMUSCULAR | Status: AC | PRN
  Administered 2020-07-13: 40 mg via INTRA_ARTICULAR

## 2020-07-13 MED ORDER — LIDOCAINE HCL 1 % IJ SOLN
3.0000 mL | INTRAMUSCULAR | Status: AC | PRN
  Administered 2020-07-13: 3 mL
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
## 2020-07-21 DIAGNOSIS — M5414 Radiculopathy, thoracic region: Secondary | ICD-10-CM | POA: Diagnosis not present

## 2020-08-04 DIAGNOSIS — M47816 Spondylosis without myelopathy or radiculopathy, lumbar region: Secondary | ICD-10-CM | POA: Diagnosis not present

## 2020-08-07 ENCOUNTER — Telehealth: Payer: Self-pay | Admitting: Family Medicine

## 2020-08-07 DIAGNOSIS — K219 Gastro-esophageal reflux disease without esophagitis: Secondary | ICD-10-CM

## 2020-08-07 NOTE — Telephone Encounter (Signed)
Patient called to ask for a referral to a GI doctor.  She was not sure if she needed to see the PCP first.  Please advise patient and call her to let her know if she needs an appt. Or if the doctor can submit the referral.  CB# [phone], ext. [phone]

## 2020-08-10 ENCOUNTER — Encounter: Payer: Self-pay | Admitting: Orthopaedic Surgery

## 2020-08-10 ENCOUNTER — Ambulatory Visit: Payer: BC Managed Care – PPO | Admitting: Orthopaedic Surgery

## 2020-08-10 ENCOUNTER — Encounter: Payer: Self-pay | Admitting: *Deleted

## 2020-08-10 DIAGNOSIS — S83231D Complex tear of medial meniscus, current injury, right knee, subsequent encounter: Secondary | ICD-10-CM

## 2020-08-10 DIAGNOSIS — M25561 Pain in right knee: Secondary | ICD-10-CM

## 2020-08-10 DIAGNOSIS — G8929 Other chronic pain: Secondary | ICD-10-CM

## 2020-08-10 NOTE — Telephone Encounter (Signed)
Ok to send referral as requested 

## 2020-08-10 NOTE — Progress Notes (Signed)
Heather Orr is a 55 year old is returning for follow-up 4 weeks after I placed her injections in both her knees.  A MRI of the right knee does show a complex medial meniscal tear with some thinning of the cartilage in the knee.  She has been having some medial joint line tenderness but states she is doing much better overall.  She is worked on activity modification.  She is working on a home exercise program as well as trying to lose weight.  She has some slight medial joint line tenderness today but no effusion of the right knee.  Range of motion is good and she is not walking with a limp or using assistive device.  Again we talked about quad strengthening exercises as well as activity modification and weight loss.  If she does develop worsening mechanical symptoms she will let us know with the right knee.  Follow-up is as needed.

## 2020-08-20 ENCOUNTER — Other Ambulatory Visit: Payer: Self-pay | Admitting: Family Medicine

## 2020-08-25 DIAGNOSIS — E1159 Type 2 diabetes mellitus with other circulatory complications: Secondary | ICD-10-CM | POA: Insufficient documentation

## 2020-08-25 DIAGNOSIS — I10 Essential (primary) hypertension: Secondary | ICD-10-CM | POA: Insufficient documentation

## 2020-08-25 DIAGNOSIS — M47816 Spondylosis without myelopathy or radiculopathy, lumbar region: Secondary | ICD-10-CM | POA: Diagnosis not present

## 2020-08-25 DIAGNOSIS — Z6841 Body Mass Index (BMI) 40.0 and over, adult: Secondary | ICD-10-CM | POA: Diagnosis not present

## 2020-08-25 DIAGNOSIS — M5414 Radiculopathy, thoracic region: Secondary | ICD-10-CM | POA: Diagnosis not present

## 2020-08-25 DIAGNOSIS — I152 Hypertension secondary to endocrine disorders: Secondary | ICD-10-CM | POA: Insufficient documentation

## 2020-09-09 ENCOUNTER — Encounter: Payer: BC Managed Care – PPO | Admitting: Family Medicine

## 2020-10-06 ENCOUNTER — Other Ambulatory Visit: Payer: Self-pay

## 2020-10-06 ENCOUNTER — Ambulatory Visit: Payer: BC Managed Care – PPO | Admitting: Gastroenterology

## 2020-10-06 ENCOUNTER — Encounter: Payer: Self-pay | Admitting: Gastroenterology

## 2020-10-06 VITALS — BP 115/67 | HR 96 | Temp 98.2°F | Ht 66.0 in | Wt 265.1 lb

## 2020-10-06 DIAGNOSIS — K219 Gastro-esophageal reflux disease without esophagitis: Secondary | ICD-10-CM

## 2020-10-06 MED ORDER — OMEPRAZOLE 40 MG PO CPDR: 40.0000 mg | DELAYED_RELEASE_CAPSULE | Freq: Every day | ORAL | 0 refills | Status: DC

## 2020-10-06 MED ORDER — LANSOPRAZOLE 30 MG PO CPDR: 30.0000 mg | DELAYED_RELEASE_CAPSULE | Freq: Every day | ORAL | 0 refills | Status: DC
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
## 2020-10-06 NOTE — Patient Instructions (Signed)
Gastroesophageal Reflux Disease, Adult  Gastroesophageal reflux (GER) happens when acid from the stomach flows up into the tube that connects the mouth and the stomach (esophagus). Normally, food travels down the esophagus and stays in the stomach to be digested. With GER, food and stomach acid sometimes move back up into the esophagus. You may have a disease called gastroesophageal reflux disease (GERD) if the reflux:  Happens often.  Causes frequent or very bad symptoms.  Causes problems such as damage to the esophagus. When this happens, the esophagus becomes sore and swollen. Over time, GERD can make small holes (ulcers) in the lining of the esophagus. What are the causes? This condition is caused by a problem with the muscle between the esophagus and the stomach. When this muscle is weak or not normal, it does not close properly to keep food and acid from coming back up from the stomach. The muscle can be weak because of:  Tobacco use.  Pregnancy.  Having a certain type of hernia (hiatal hernia).  Alcohol use.  Certain foods and drinks, such as coffee, chocolate, onions, and peppermint. What increases the risk?  Being overweight.  Having a disease that affects your connective tissue.  Taking NSAIDs, such a ibuprofen. What are the signs or symptoms?  Heartburn.  Difficult or painful swallowing.  The feeling of having a lump in the throat.  A bitter taste in the mouth.  Bad breath.  Having a lot of saliva.  Having an upset or bloated stomach.  Burping.  Chest pain. Different conditions can cause chest pain. Make sure you see your doctor if you have chest pain.  Shortness of breath or wheezing.  A long-term cough or a cough at night.  Wearing away of the surface of teeth (tooth enamel).  Weight loss. How is this treated?  Making changes to your diet.  Taking medicine.  Having surgery. Treatment will depend on how bad your symptoms are. Follow these  instructions at home: Eating and drinking  Follow a diet as told by your doctor. You may need to avoid foods and drinks such as: ? Coffee and tea, with or without caffeine. ? Drinks that contain alcohol. ? Energy drinks and sports drinks. ? Bubbly (carbonated) drinks or sodas. ? Chocolate and cocoa. ? Peppermint and mint flavorings. ? Garlic and onions. ? Horseradish. ? Spicy and acidic foods. These include peppers, chili powder, curry powder, vinegar, hot sauces, and BBQ sauce. ? Citrus fruit juices and citrus fruits, such as oranges, lemons, and limes. ? Tomato-based foods. These include red sauce, chili, salsa, and pizza with red sauce. ? Fried and fatty foods. These include donuts, french fries, potato chips, and high-fat dressings. ? High-fat meats. These include hot dogs, rib eye steak, sausage, ham, and bacon. ? High-fat dairy items, such as whole milk, butter, and cream cheese.  Eat small meals often. Avoid eating large meals.  Avoid drinking large amounts of liquid with your meals.  Avoid eating meals during the 2-3 hours before bedtime.  Avoid lying down right after you eat.  Do not exercise right after you eat.   Lifestyle  Do not smoke or use any products that contain nicotine or tobacco. If you need help quitting, ask your doctor.  Try to lower your stress. If you need help doing this, ask your doctor.  If you are overweight, lose an amount of weight that is healthy for you. Ask your doctor about a safe weight loss goal.   General instructions  Pay attention to any changes in your symptoms.  Take over-the-counter and prescription medicines only as told by your doctor.  Do not take aspirin, ibuprofen, or other NSAIDs unless your doctor says it is okay.  Wear loose clothes. Do not wear anything tight around your waist.  Raise (elevate) the head of your bed about 6 inches (15 cm). You may need to use a wedge to do this.  Avoid bending over if this makes your  symptoms worse.  Keep all follow-up visits. Contact a doctor if:  You have new symptoms.  You lose weight and you do not know why.  You have trouble swallowing or it hurts to swallow.  You have wheezing or a cough that keeps happening.  You have a hoarse voice.  Your symptoms do not get better with treatment. Get help right away if:  You have sudden pain in your arms, neck, jaw, teeth, or back.  You suddenly feel sweaty, dizzy, or light-headed.  You have chest pain or shortness of breath.  You vomit and the vomit is green, yellow, or black, or it looks like blood or coffee grounds.  You faint.  Your poop (stool) is red, bloody, or black.  You cannot swallow, drink, or eat. These symptoms may represent a serious problem that is an emergency. Do not wait to see if the symptoms will go away. Get medical help right away. Call your local emergency services (911 in the [place] Do not drive yourself to the hospital. Summary  If a person has gastroesophageal reflux disease (GERD), food and stomach acid move back up into the esophagus and cause symptoms or problems such as damage to the esophagus.  Treatment will depend on how bad your symptoms are.  Follow a diet as told by your doctor.  Take all medicines only as told by your doctor. This information is not intended to replace advice given to you by your health care provider. Make sure you discuss any questions you have with your health care provider. Document Revised: 11/18/2019 Document Reviewed: 11/18/2019 Elsevier Patient Education  [employer].

## 2020-10-09 ENCOUNTER — Telehealth: Payer: Self-pay | Admitting: Gastroenterology

## 2020-10-09 NOTE — Telephone Encounter (Signed)
lansoprazole (PREVACID) 30 MG capsule Insurance will not cover. What else can she take? Please advise patient.

## 2020-10-12 ENCOUNTER — Encounter: Payer: Self-pay | Admitting: Gastroenterology

## 2020-10-12 NOTE — Telephone Encounter (Signed)
Since she is allergic to Nexium and her insurance not covering Prevacid, lets wait for the upper endoscopy tomorrow before I give further recommendations  RV

## 2020-10-12 NOTE — Telephone Encounter (Signed)
Patient bought OTC prevacid yesterday and took it this morning. She verbalized understanding of instructions

## 2020-10-13 ENCOUNTER — Encounter
Admission: RE | Disposition: A | Discharge status: Home / Self Care | Payer: Self-pay | Source: Home / Self Care | Attending: Gastroenterology

## 2020-10-13 ENCOUNTER — Ambulatory Visit: Payer: BC Managed Care – PPO | Admitting: Registered Nurse

## 2020-10-13 ENCOUNTER — Encounter: Payer: Self-pay | Admitting: Gastroenterology

## 2020-10-13 ENCOUNTER — Ambulatory Visit
Admission: RE | Admit: 2020-10-13 | Discharge: 2020-10-13 | Disposition: A | Discharge status: Home / Self Care | Payer: BC Managed Care – PPO | Attending: Gastroenterology | Admitting: Gastroenterology

## 2020-10-13 ENCOUNTER — Other Ambulatory Visit: Payer: Self-pay

## 2020-10-13 DIAGNOSIS — Z96641 Presence of right artificial hip joint: Secondary | ICD-10-CM | POA: Diagnosis not present

## 2020-10-13 DIAGNOSIS — Z87891 Personal history of nicotine dependence: Secondary | ICD-10-CM | POA: Diagnosis not present

## 2020-10-13 DIAGNOSIS — Z13810 Encounter for screening for upper gastrointestinal disorder: Secondary | ICD-10-CM | POA: Insufficient documentation

## 2020-10-13 DIAGNOSIS — K3189 Other diseases of stomach and duodenum: Secondary | ICD-10-CM | POA: Insufficient documentation

## 2020-10-13 DIAGNOSIS — Z888 Allergy status to other drugs, medicaments and biological substances status: Secondary | ICD-10-CM | POA: Insufficient documentation

## 2020-10-13 DIAGNOSIS — K2289 Other specified disease of esophagus: Secondary | ICD-10-CM | POA: Insufficient documentation

## 2020-10-13 DIAGNOSIS — Z79899 Other long term (current) drug therapy: Secondary | ICD-10-CM | POA: Insufficient documentation

## 2020-10-13 DIAGNOSIS — K219 Gastro-esophageal reflux disease without esophagitis: Secondary | ICD-10-CM | POA: Diagnosis not present

## 2020-10-13 DIAGNOSIS — R12 Heartburn: Secondary | ICD-10-CM | POA: Insufficient documentation

## 2020-10-13 DIAGNOSIS — K259 Gastric ulcer, unspecified as acute or chronic, without hemorrhage or perforation: Secondary | ICD-10-CM | POA: Diagnosis not present

## 2020-10-13 HISTORY — DX: Personal history of urinary calculi: Z87.442

## 2020-10-13 HISTORY — PX: ESOPHAGOGASTRODUODENOSCOPY (EGD) WITH PROPOFOL: SHX5813

## 2020-10-13 SURGERY — ESOPHAGOGASTRODUODENOSCOPY (EGD) WITH PROPOFOL
Anesthesia: General

## 2020-10-13 MED ORDER — DEXMEDETOMIDINE HCL 200 MCG/2ML IV SOLN
INTRAVENOUS | Status: DC | PRN
  Administered 2020-10-13: 20 ug via INTRAVENOUS

## 2020-10-13 MED ORDER — PROPOFOL 10 MG/ML IV BOLUS
INTRAVENOUS | Status: DC | PRN
  Administered 2020-10-13: 100 mg via INTRAVENOUS

## 2020-10-13 MED ORDER — SODIUM CHLORIDE 0.9 % IV SOLN: INTRAVENOUS | Status: DC

## 2020-10-13 MED ORDER — PROPOFOL 500 MG/50ML IV EMUL
INTRAVENOUS | Status: DC | PRN
  Administered 2020-10-13: 150 ug/kg/min via INTRAVENOUS

## 2020-10-13 MED ORDER — LIDOCAINE HCL (CARDIAC) PF 100 MG/5ML IV SOSY
PREFILLED_SYRINGE | INTRAVENOUS | Status: DC | PRN
  Administered 2020-10-13: 100 mg via INTRAVENOUS

## 2020-10-13 MED ORDER — GLYCOPYRROLATE 0.2 MG/ML IJ SOLN
INTRAMUSCULAR | Status: DC | PRN
  Administered 2020-10-13: .2 mg via INTRAVENOUS
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
## 2020-10-13 NOTE — Op Note (Signed)
[facility] Gastroenterology Patient Name: Heather Orr Procedure Date: 10/13/2020 9:55 AM MRN: [id number] Account #: [id number] Date of Birth: [birth year] Admit Type: Outpatient Age: 55 Room: [facility] ENDO ROOM 3 Gender: Female Note Status: Finalized Procedure:             Upper GI endoscopy Indications:           Screening for Barrett's esophagus in patient at risk                         for this condition, Heartburn Providers:             Lin Landsman MD, MD Medicines:             General Anesthesia Complications:         No immediate complications. Estimated blood loss: None. Procedure:             Pre-Anesthesia Assessment:                        - Prior to the procedure, a History and Physical was                         performed, and patient medications and allergies were                         reviewed. The patient is competent. The risks and                         benefits of the procedure and the sedation options and                         risks were discussed with the patient. All questions                         were answered and informed consent was obtained.                         Patient identification and proposed procedure were                         verified by the physician, the nurse, the                         anesthesiologist, the anesthetist and the technician                         in the pre-procedure area in the procedure room in the                         endoscopy suite. Mental Status Examination: alert and                         oriented. Airway Examination: normal oropharyngeal                         airway and neck mobility. Respiratory Examination:                         clear to auscultation. CV  Examination: normal.                         Prophylactic Antibiotics: The patient does not require                         prophylactic antibiotics. Prior Anticoagulants: The                         patient has taken no  previous anticoagulant or                         antiplatelet agents. ASA Grade Assessment: II - A                         patient with mild systemic disease. After reviewing                         the risks and benefits, the patient was deemed in                         satisfactory condition to undergo the procedure. The                         anesthesia plan was to use monitored anesthesia care                         (MAC). Immediately prior to administration of                         medications, the patient was re-assessed for adequacy                         to receive sedatives. The heart rate, respiratory                         rate, oxygen saturations, blood pressure, adequacy of                         pulmonary ventilation, and response to care were                         monitored throughout the procedure. The physical                         status of the patient was re-assessed after the                         procedure.                        After obtaining informed consent, the endoscope was                         passed under direct vision. Throughout the procedure,                         the patient's blood pressure, pulse, and oxygen  saturations were monitored continuously. The Endoscope                         was introduced through the mouth, and advanced to the                         second part of duodenum. The upper GI endoscopy was                         accomplished without difficulty. The patient tolerated                         the procedure well. Findings:      The duodenal bulb and second portion of the duodenum were normal.      Multiple dispersed diminutive erosions with no bleeding and no stigmata       of recent bleeding were found in the gastric antrum. Biopsies were taken       with a cold forceps for Helicobacter pylori testing.      The gastric body and incisura were normal. Biopsies were taken with a       cold  forceps for Helicobacter pylori testing.      The cardia and gastric fundus were normal on retroflexion.      Scattered islands of salmon-colored mucosa were present from 35 to 36       cm. No other visible abnormalities were present. The maximum       longitudinal extent of these esophageal mucosal changes was 1 cm in       length. Biopsies were taken with a cold forceps for histology.      The examined esophagus was normal. Biopsies were taken with a cold       forceps for histology.      Esophagogastric landmarks were identified: the gastroesophageal junction       was found at 35 cm from the incisors. Impression:            - Normal duodenal bulb and second portion of the                         duodenum.                        - Erosive gastropathy with no bleeding and no stigmata                         of recent bleeding. Biopsied.                        - Normal gastric body and incisura. Biopsied.                        - Salmon-colored mucosa. Biopsied.                        - Normal esophagus. Biopsied.                        - Esophagogastric landmarks identified. Recommendation:        - Await pathology results.                        -  Discharge patient to home (with escort).                        - Resume previous diet.                        - Continue present medications.                        - Follow an antireflux regimen. Procedure Code(s):     --- Professional ---                        [phone], Esophagogastroduodenoscopy, flexible,                         transoral; with biopsy, single or multiple Diagnosis Code(s):     --- Professional ---                        K22.8, Other specified diseases of esophagus                        K31.89, Other diseases of stomach and duodenum                        Z13.810, Encounter for screening for upper                         gastrointestinal disorder                        R12, Heartburn CPT copyright 2019 American Medical  Association. All rights reserved. The codes documented in this report are preliminary and upon coder review may  be revised to meet current compliance requirements. Dr. Ulyess Mort Lin Landsman MD, MD 10/13/2020 10:17:22 AM This report has been signed electronically. Number of Addenda: 0 Note Initiated On: 10/13/2020 9:55 AM Estimated Blood Loss:  Estimated blood loss: none.      [facility]

## 2020-10-13 NOTE — Anesthesia Postprocedure Evaluation (Signed)
Anesthesia Post Note  [name]  Procedure(s) Performed: ESOPHAGOGASTRODUODENOSCOPY (EGD) WITH PROPOFOL (N/A )  Patient location during evaluation: Phase II Anesthesia Type: General Level of consciousness: awake and alert, awake and oriented Pain management: pain level controlled Vital Signs Assessment: post-procedure vital signs reviewed and stable Respiratory status: spontaneous breathing, nonlabored ventilation and respiratory function stable Cardiovascular status: blood pressure returned to baseline and stable Postop Assessment: no apparent nausea or vomiting Anesthetic complications: no   No complications documented.   Last Vitals:  Vitals:   10/13/20 1018 10/13/20 1028  BP:  135/65  Pulse:    Resp:    Temp: (!) 35.8 C   SpO2:      Last Pain:  Vitals:   10/13/20 1048  TempSrc:   PainSc: 0-No pain                 Phill Mutter

## 2020-10-13 NOTE — Transfer of Care (Signed)
Immediate Anesthesia Transfer of Care Note  [name]  Procedure(s) Performed: ESOPHAGOGASTRODUODENOSCOPY (EGD) WITH PROPOFOL (N/A )  Patient Location: Endoscopy Unit  Anesthesia Type:General  Level of Consciousness: drowsy  Airway & Oxygen Therapy: Patient Spontanous Breathing and Patient connected to nasal cannula oxygen  Post-op Assessment: Report given to RN and Post -op Vital signs reviewed and stable  Post vital signs: Reviewed and stable  Last Vitals:  Vitals Value Taken Time  BP 141/80 10/13/20 1017  Temp    Pulse 63 10/13/20 1018  Resp 20 10/13/20 1018  SpO2 91 % 10/13/20 1018  Vitals shown include unvalidated device data.  Last Pain:  Vitals:   10/13/20 0853  TempSrc: Temporal  PainSc: 0-No pain         Complications: No complications documented.
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
## 2020-10-14 ENCOUNTER — Encounter: Payer: Self-pay | Admitting: Gastroenterology

## 2020-10-14 LAB — SURGICAL PATHOLOGY

## 2020-10-16 ENCOUNTER — Encounter: Payer: Self-pay | Admitting: Family Medicine

## 2020-10-16 ENCOUNTER — Other Ambulatory Visit: Payer: Self-pay

## 2020-10-16 ENCOUNTER — Ambulatory Visit (INDEPENDENT_AMBULATORY_CARE_PROVIDER_SITE_OTHER): Payer: BC Managed Care – PPO | Admitting: Family Medicine

## 2020-10-16 VITALS — BP 126/83 | HR 70 | Temp 98.3°F | Resp 16 | Wt 262.0 lb

## 2020-10-16 DIAGNOSIS — E7800 Pure hypercholesterolemia, unspecified: Secondary | ICD-10-CM | POA: Diagnosis not present

## 2020-10-16 DIAGNOSIS — Z0001 Encounter for general adult medical examination with abnormal findings: Secondary | ICD-10-CM

## 2020-10-16 DIAGNOSIS — Z6841 Body Mass Index (BMI) 40.0 and over, adult: Secondary | ICD-10-CM | POA: Diagnosis not present

## 2020-10-16 DIAGNOSIS — Z1159 Encounter for screening for other viral diseases: Secondary | ICD-10-CM

## 2020-10-16 DIAGNOSIS — Z1231 Encounter for screening mammogram for malignant neoplasm of breast: Secondary | ICD-10-CM

## 2020-10-16 DIAGNOSIS — I10 Essential (primary) hypertension: Secondary | ICD-10-CM

## 2020-10-16 DIAGNOSIS — Z0000 Encounter for general adult medical examination without abnormal findings: Secondary | ICD-10-CM

## 2020-10-16 DIAGNOSIS — R002 Palpitations: Secondary | ICD-10-CM

## 2020-10-16 NOTE — Assessment & Plan Note (Signed)
Reviewed last lipid panel Not currently on a statin Recheck FLP and CMP Discussed diet and exercise  

## 2020-10-16 NOTE — Assessment & Plan Note (Signed)
Recurrent issue and now worsening EKG NSR today Previously evaluated by cardiology, but has not had a cardiac monitor Zio patch applied today Check CMP, CBC, TSH for underlying pathology Pending Zio patch results, could consider beta-blocker

## 2020-10-16 NOTE — Assessment & Plan Note (Signed)
Well-controlled with lifestyle measures On no antihypertensives Recheck metabolic panel

## 2020-10-16 NOTE — Assessment & Plan Note (Signed)
Discussed importance of healthy weight management Discussed diet and exercise  

## 2020-10-16 NOTE — Patient Instructions (Addendum)
[email or web address]  Preventive Care 56-56 Years Old, Female Preventive care refers to lifestyle choices and visits with your health care provider that can promote health and wellness. This includes:  A yearly physical exam. This is also called an annual wellness visit.  Regular dental and eye exams.  Immunizations.  Screening for certain conditions.  Healthy lifestyle choices, such as: ? Eating a healthy diet. ? Getting regular exercise. ? Not using drugs or products that contain nicotine and tobacco. ? Limiting alcohol use. What can I expect for my preventive care visit? Physical exam Your health care provider will check your:  Height and weight. These may be used to calculate your BMI (body mass index). BMI is a measurement that tells if you are at a healthy weight.  Heart rate and blood pressure.  Body temperature.  Skin for abnormal spots. Counseling Your health care provider may ask you questions about your:  Past medical problems.  Family's medical history.  Alcohol, tobacco, and drug use.  Emotional well-being.  Home life and relationship well-being.  Sexual activity.  Diet, exercise, and sleep habits.  Work and work Statistician.  Access to firearms.  Method of birth control.  Menstrual cycle.  Pregnancy history. What immunizations do I need? Vaccines are usually given at various ages, according to a schedule. Your health care provider will recommend vaccines for you based on your age, medical history, and lifestyle or other factors, such as travel or where you work.   What tests do I need? Blood tests  Lipid and cholesterol levels. These may be checked every 5 years, or more often if you are over 29 years old.  Hepatitis C test.  Hepatitis B test. Screening  Lung cancer screening. You may have this screening every year starting at age 46 if you have a 30-pack-year history of smoking and  currently smoke or have quit within the past 15 years.  Colorectal cancer screening. ? All adults should have this screening starting at age 62 and continuing until age 54. ? Your health care provider may recommend screening at age 51 if you are at increased risk. ? You will have tests every 1-10 years, depending on your results and the type of screening test.  Diabetes screening. ? This is done by checking your blood sugar (glucose) after you have not eaten for a while (fasting). ? You may have this done every 1-3 years.  Mammogram. ? This may be done every 1-2 years. ? Talk with your health care provider about when you should start having regular mammograms. This may depend on whether you have a family history of breast cancer.  BRCA-related cancer screening. This may be done if you have a family history of breast, ovarian, tubal, or peritoneal cancers.  Pelvic exam and Pap test. ? This may be done every 3 years starting at age 6. ? Starting at age 66, this may be done every 5 years if you have a Pap test in combination with an HPV test. Other tests  STD (sexually transmitted disease) testing, if you are at risk.  Bone density scan. This is done to screen for osteoporosis. You may have this scan if you are at high risk for osteoporosis. Talk with your health care provider about your test results, treatment options, and if necessary, the need for more tests. Follow these instructions at home: Eating and drinking  Eat a diet that includes fresh fruits and vegetables, whole grains, lean protein, and low-fat dairy products.  Take vitamin and  mineral supplements as recommended by your health care provider.  Do not drink alcohol if: ? Your health care provider tells you not to drink. ? You are pregnant, may be pregnant, or are planning to become pregnant.  If you drink alcohol: ? Limit how much you have to 0-1 drink a day. ? Be aware of how much alcohol is in your drink. In the  [place] one drink equals one 12 oz bottle of beer (355 mL), one 5 oz glass of wine (148 mL), or one 1 oz glass of hard liquor (44 mL).   Lifestyle  Take daily care of your teeth and gums. Brush your teeth every morning and night with fluoride toothpaste. Floss one time each day.  Stay active. Exercise for at least 30 minutes 5 or more days each week.  Do not use any products that contain nicotine or tobacco, such as cigarettes, e-cigarettes, and chewing tobacco. If you need help quitting, ask your health care provider.  Do not use drugs.  If you are sexually active, practice safe sex. Use a condom or other form of protection to prevent STIs (sexually transmitted infections).  If you do not wish to become pregnant, use a form of birth control. If you plan to become pregnant, see your health care provider for a prepregnancy visit.  If told by your health care provider, take low-dose aspirin daily starting at age 63.  Find healthy ways to cope with stress, such as: ? Meditation, yoga, or listening to music. ? Journaling. ? Talking to a trusted person. ? Spending time with friends and family. Safety  Always wear your seat belt while driving or riding in a vehicle.  Do not drive: ? If you have been drinking alcohol. Do not ride with someone who has been drinking. ? When you are tired or distracted. ? While texting.  Wear a helmet and other protective equipment during sports activities.  If you have firearms in your house, make sure you follow all gun safety procedures. What's next?  Visit your health care provider once a year for an annual wellness visit.  Ask your health care provider how often you should have your eyes and teeth checked.  Stay up to date on all vaccines. This information is not intended to replace advice given to you by your health care provider. Make sure you discuss any questions you have with your health care provider. Document Revised: 02/11/2020 Document  Reviewed: 01/18/2018 Elsevier Patient Education  2021 [employer].
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
## 2020-10-17 LAB — CBC WITH DIFFERENTIAL/PLATELET
Basophils Absolute: 0 10*3/uL (ref 0.0–0.2)
Basos: 1 %
EOS (ABSOLUTE): 0.1 10*3/uL (ref 0.0–0.4)
Eos: 2 %
Hematocrit: 44.8 % (ref 34.0–46.6)
Hemoglobin: 15.4 g/dL (ref 11.1–15.9)
Immature Grans (Abs): 0 10*3/uL (ref 0.0–0.1)
Immature Granulocytes: 0 %
Lymphocytes Absolute: 3.7 10*3/uL — ABNORMAL HIGH (ref 0.7–3.1)
Lymphs: 45 %
MCH: 31.2 pg (ref 26.6–33.0)
MCHC: 34.4 g/dL (ref 31.5–35.7)
MCV: 91 fL (ref 79–97)
Monocytes Absolute: 0.5 10*3/uL (ref 0.1–0.9)
Monocytes: 7 %
Neutrophils Absolute: 3.9 10*3/uL (ref 1.4–7.0)
Neutrophils: 45 %
Platelets: 339 10*3/uL (ref 150–450)
RBC: 4.93 x10E6/uL (ref 3.77–5.28)
RDW: 12.8 % (ref 11.7–15.4)
WBC: 8.3 10*3/uL (ref 3.4–10.8)

## 2020-10-17 LAB — LIPID PANEL
Chol/HDL Ratio: 4.4 ratio (ref 0.0–4.4)
Cholesterol, Total: 221 mg/dL — ABNORMAL HIGH (ref 100–199)
HDL: 50 mg/dL (ref >39)
LDL Chol Calc (NIH): 143 mg/dL — ABNORMAL HIGH (ref 0–99)
Triglycerides: 157 mg/dL — ABNORMAL HIGH (ref 0–149)
VLDL Cholesterol Cal: 28 mg/dL (ref 5–40)

## 2020-10-17 LAB — COMPREHENSIVE METABOLIC PANEL
ALT: 69 IU/L — ABNORMAL HIGH (ref 0–32)
AST: 53 IU/L — ABNORMAL HIGH (ref 0–40)
Albumin/Globulin Ratio: 1.7 (ref 1.2–2.2)
Albumin: 4.2 g/dL (ref 3.8–4.9)
Alkaline Phosphatase: 77 IU/L (ref 44–121)
BUN/Creatinine Ratio: 13 (ref 9–23)
BUN: 8 mg/dL (ref 6–24)
Bilirubin Total: 0.6 mg/dL (ref 0.0–1.2)
CO2: 21 mmol/L (ref 20–29)
Calcium: 9.7 mg/dL (ref 8.7–10.2)
Chloride: 102 mmol/L (ref 96–106)
Creatinine, Ser: 0.64 mg/dL (ref 0.57–1.00)
Globulin, Total: 2.5 g/dL (ref 1.5–4.5)
Glucose: 98 mg/dL (ref 65–99)
Potassium: 4.4 mmol/L (ref 3.5–5.2)
Sodium: 140 mmol/L (ref 134–144)
Total Protein: 6.7 g/dL (ref 6.0–8.5)
eGFR: 104 mL/min/{1.73_m2} (ref >59)

## 2020-10-17 LAB — IRON,TIBC AND FERRITIN PANEL
Ferritin: 434 ng/mL — ABNORMAL HIGH (ref 15–150)
Iron Saturation: 40 % (ref 15–55)
Iron: 112 ug/dL (ref 27–159)
Total Iron Binding Capacity: 280 ug/dL (ref 250–450)
UIBC: 168 ug/dL (ref 131–425)

## 2020-10-17 LAB — HEPATITIS C ANTIBODY: Hep C Virus Ab: <0.1 s/co ratio (ref 0.0–0.9)

## 2020-10-17 LAB — TSH: TSH: 0.892 u[IU]/mL (ref 0.450–4.500)

## 2020-10-28 DIAGNOSIS — R002 Palpitations: Secondary | ICD-10-CM | POA: Diagnosis not present

## 2020-10-30 ENCOUNTER — Telehealth (INDEPENDENT_AMBULATORY_CARE_PROVIDER_SITE_OTHER): Payer: BC Managed Care – PPO

## 2020-10-30 DIAGNOSIS — R000 Tachycardia, unspecified: Secondary | ICD-10-CM | POA: Diagnosis not present

## 2020-10-30 NOTE — Telephone Encounter (Signed)
Copied from [place] [id number]. Topic: Quick Communication - Other Results (Clinic Use ONLY) >> Oct 30, 2020  7:12 AM Lennox Solders wrote: Pt is calling and would like results of holter monitor

## 2020-11-09 ENCOUNTER — Encounter: Payer: Self-pay | Admitting: Family Medicine

## 2020-11-09 DIAGNOSIS — Z1231 Encounter for screening mammogram for malignant neoplasm of breast: Secondary | ICD-10-CM | POA: Diagnosis not present

## 2020-11-09 LAB — HM MAMMOGRAPHY

## 2020-11-09 NOTE — Telephone Encounter (Signed)
Patient called in to inquire if Dr B have gotten the result of her Holter Monitor test as yet. Say its been over 2 weeks and she have yet to hear from the office. Please call with an answer at Ph# [phone] ext # [phone]

## 2020-11-09 NOTE — Telephone Encounter (Signed)
When you receive results please review and advise. Thanks. KW

## 2020-11-10 NOTE — Telephone Encounter (Signed)
Patient called and advised of results as noted by Dr. Brita Romp noted 11/10/20, patient verbalized understanding. She asked how long will it take for Metoprolol to work, advised it has to be swallowed and it could take anywhere from 30 minutes to 1 hour before it's absorbed into the body. She says that will not do her any good. She asked what symptoms should she look for because she only feels her heart rate go up and then she has palpitations. I advised if she's SOB, has chest pain, sweating with the elevated HR and/or palpitations, she should go to the ED for evaluation. She says she will monitor for those symptoms. I asked about scheduling for the 1 month f/u, she says she will call her cardiologist office and schedule an appointment.

## 2020-11-10 NOTE — Telephone Encounter (Signed)
Zio patch results from 5/27-5/30 received and personally reviewed. Findings include:  Patient had a minimum HR of 48 bpm, max HR of 160 bpm, and avg HR of 92 bpm.  Predominant underlying rhythm was Sinus Rhythm. Isolated SVEs were rare (<1%), SVE Couplets were rare (<1%), and SVE Triplets were rare (<1%). Isolated VEs were rare (<1%), VE Triplets were rare (<1%), and no VE Couplets were present.  Overall, this corresponds to intermittent sinus tachycardia causing her palpitations.  This is not a dangerous arrhythmia.  Can use prn Metoprolol to slow heart rate if symptomatic. Please let me know if she wants to try this and I will send in and then would see her back in 1 month for follow-up.

## 2020-11-10 NOTE — Telephone Encounter (Signed)
Patient is returning a call to [place].  Please call patient back with results.  CB# [phone]

## 2020-11-10 NOTE — Telephone Encounter (Signed)
LMTCB okay for [facility] triage to advise patient of results and providers message below. KW

## 2020-11-10 NOTE — Telephone Encounter (Signed)
Noted  

## 2020-11-21 ENCOUNTER — Other Ambulatory Visit: Payer: Self-pay | Admitting: Family Medicine

## 2020-11-25 ENCOUNTER — Encounter: Payer: Self-pay | Admitting: *Deleted

## 2020-12-23 ENCOUNTER — Ambulatory Visit: Payer: BC Managed Care – PPO | Admitting: Podiatry

## 2020-12-23 ENCOUNTER — Other Ambulatory Visit: Payer: Self-pay

## 2020-12-23 ENCOUNTER — Encounter: Payer: Self-pay | Admitting: Podiatry

## 2020-12-23 DIAGNOSIS — M722 Plantar fascial fibromatosis: Secondary | ICD-10-CM

## 2020-12-23 DIAGNOSIS — M7661 Achilles tendinitis, right leg: Secondary | ICD-10-CM

## 2020-12-23 MED ORDER — TRIAMCINOLONE ACETONIDE 40 MG/ML IJ SUSP
20.0000 mg | Freq: Once | INTRAMUSCULAR | Status: AC
  Administered 2020-12-23: 20 mg

## 2020-12-23 MED ORDER — DEXAMETHASONE SODIUM PHOSPHATE 120 MG/30ML IJ SOLN
2.0000 mg | Freq: Once | INTRAMUSCULAR | Status: AC
  Administered 2020-12-23: 2 mg via INTRA_ARTICULAR

## 2020-12-23 MED ORDER — MELOXICAM 15 MG PO TABS: 15.0000 mg | ORAL_TABLET | Freq: Every day | ORAL | 3 refills | Status: DC

## 2020-12-23 MED ORDER — METHYLPREDNISOLONE 4 MG PO TBPK: ORAL_TABLET | ORAL | 0 refills | Status: DC

## 2020-12-23 NOTE — Progress Notes (Signed)
Heather Orr presents today for follow-up of her Achilles bursitis and Planter fasciitis left.  She states that the Achilles tendon bothering her couple months ago and the Planter fasciitis is resulting in some burning in the arch and radiating up the leg.  Objective: Vital signs are stable alert and x3 pulses are palpable.  Palpable bursa posterior aspect of the right heel which exquisitely tender and she also has pain on palpation medial calcaneal tubercle of the left heel.  She has no open lesions or wounds.  Assessment: Bursitis Achilles tendinitis right.  Plan fasciitis left.  Plan: At this point I injected dexamethasone and local anesthetic into the bursa today.  Tolerated procedure well.  Injected Kenalog and local anesthetic to the plantar fascia.  Started her on methylprednisolone to be followed by meloxicam.  She will continue to wear her good shoes and I will follow-up with her in 6 weeks

## 2021-01-06 ENCOUNTER — Ambulatory Visit: Payer: BC Managed Care – PPO | Admitting: Gastroenterology

## 2021-02-01 ENCOUNTER — Ambulatory Visit: Payer: BC Managed Care – PPO | Admitting: Podiatry

## 2021-03-11 ENCOUNTER — Ambulatory Visit: Payer: Self-pay | Admitting: *Deleted

## 2021-03-11 NOTE — Telephone Encounter (Signed)
Reason for Disposition  Breast lump  Answer Assessment - Initial Assessment Questions 1. SYMPTOM: "What's the main symptom you're concerned about?"  (e.g., lump, pain, rash, nipple discharge)     I returned her call.   C/O right  breast pain for 10 days.   2. LOCATION: "Where is the Pain located?"     Pain under my right armpit.   I thought I felt a knot this morning.   I had a mammogram a couple of months ago.   The report was fine.     The knot is in the side of my right breast. 3. ONSET: "When did pain  start?"     10 days ago.   No longer having periods 4. PRIOR HISTORY: "Do you have any history of prior problems with your breasts?" (e.g., lumps, cancer, fibrocystic breast disease)     No problems 5. CAUSE: "What do you think is causing this symptom?"     After talking with my partner we have a 13 mo puppy that jumps on me at times.    6. OTHER SYMPTOMS: "Do you have any other symptoms?" (e.g., fever, breast pain, redness or rash, nipple discharge)     My right shoulder blade and neck down to my elbow hurts.   I don't remember doing anything to hurt myself.   It's the pain more intense in my breast than my arm.   My arm is an ache.    7. PREGNANCY-BREASTFEEDING: "Is there any chance you are pregnant?" "When was your last menstrual period?" "Are you breastfeeding?"     No  Protocols used: Breast Symptoms-A-AH

## 2021-03-11 NOTE — Telephone Encounter (Signed)
I returned pt's call.   She had called in earlier c/o having right breast pain for 10 days approximately.   Also pain in her right shoulder blade and down her right arm to her elbow.   No accidents or injuries.   She just had a mammogram 2 months ago and it looked fine.  There were no appts available with Dr. Brita Orr.   I called into the office and spoke with Elmyra Ricks.   Pt is scheduled to see Heather Drubel, PA-C 03/12/2021 at 8:00.  Pt was agreeable to this time.

## 2021-03-12 ENCOUNTER — Encounter: Payer: Self-pay | Admitting: Physician Assistant

## 2021-03-12 ENCOUNTER — Ambulatory Visit: Payer: BC Managed Care – PPO | Admitting: Physician Assistant

## 2021-03-12 ENCOUNTER — Other Ambulatory Visit: Payer: Self-pay

## 2021-03-12 VITALS — BP 128/78 | HR 83 | Ht 65.0 in | Wt 267.3 lb

## 2021-03-12 DIAGNOSIS — N644 Mastodynia: Secondary | ICD-10-CM

## 2021-03-12 DIAGNOSIS — M25511 Pain in right shoulder: Secondary | ICD-10-CM | POA: Diagnosis not present

## 2021-03-12 MED ORDER — CELECOXIB 100 MG PO CAPS: 100.0000 mg | ORAL_CAPSULE | Freq: Two times a day (BID) | ORAL | 0 refills | Status: AC

## 2021-03-12 NOTE — Progress Notes (Signed)
Established patient visit   [name]   DOB: 08-12-1965   55 y.o. Female  MRN: [id number] Visit Date: 03/12/2021  Today's healthcare provider: Mikey Kirschner, PA-C   Cc. Right breast pain  Subjective    HPI  Heather Orr is a 55 y/o female who presents today for evaluation of right lateral breast pain x  2 weeks. She states that she repeatedly uses her right arm to open and close a heavy machine at work. She feels right lateral breast pain, that she believes radiates from her right neck and shoulder. Denies any ROM limitations, denies any breast masses or skin changes.  Last mammogram was 6/22, B2, fatty. She takes Meloxicam chronically for ankle bursitis, this has no improvement in ankle or current breast/neck pain.     Medications: Outpatient Medications Prior to Visit  Medication Sig   Chlorpheniramine Maleate (ALLERGY PO) Take by mouth as needed.   citalopram (CELEXA) 40 MG tablet TAKE 1 TABLET BY MOUTH EVERY DAY   fluticasone (FLONASE) 50 MCG/ACT nasal spray Place 2 sprays into both nostrils daily.   lansoprazole (PREVACID) 30 MG capsule Take 1 capsule (30 mg total) by mouth daily before breakfast.   meloxicam (MOBIC) 15 MG tablet Take 1 tablet (15 mg total) by mouth daily.   methylPREDNISolone (MEDROL DOSEPAK) 4 MG TBPK tablet 6 day dose pack - take as directed   traZODone (DESYREL) 150 MG tablet TAKE 1 TABLET (150 MG TOTAL) BY MOUTH AT BEDTIME.   No facility-administered medications prior to visit.    Review of Systems  Constitutional:  Negative for fatigue.  Genitourinary:        Right breast breast  Musculoskeletal:  Positive for arthralgias, back pain, myalgias, neck pain and neck stiffness.  Skin:  Negative for color change and rash.   Last CBC Lab Results  Component Value Date   WBC 8.3 10/16/2020   HGB 15.4 10/16/2020   HCT 44.8 10/16/2020   MCV 91 10/16/2020   MCH 31.2 10/16/2020   RDW 12.8 10/16/2020   PLT 339 30/11/6224   Last metabolic  panel Lab Results  Component Value Date   GLUCOSE 98 10/16/2020   NA 140 10/16/2020   K 4.4 10/16/2020   CL 102 10/16/2020   CO2 21 10/16/2020   BUN 8 10/16/2020   CREATININE 0.64 10/16/2020   EGFR 104 10/16/2020   CALCIUM 9.7 10/16/2020   PROT 6.7 10/16/2020   ALBUMIN 4.2 10/16/2020   LABGLOB 2.5 10/16/2020   AGRATIO 1.7 10/16/2020   BILITOT 0.6 10/16/2020   ALKPHOS 77 10/16/2020   AST 53 (H) 10/16/2020   ALT 69 (H) 10/16/2020   ANIONGAP 8 09/23/2017   Last lipids Lab Results  Component Value Date   CHOL 221 (H) 10/16/2020   HDL 50 10/16/2020   LDLCALC 143 (H) 10/16/2020   TRIG 157 (H) 10/16/2020   CHOLHDL 4.4 10/16/2020   Last hemoglobin A1c No results found for: HGBA1C Last thyroid functions Lab Results  Component Value Date   TSH 0.892 10/16/2020       Objective  Blood pressure 128/78, pulse 83, height '5\' 5"'  (1.651 m), weight 267 lb 4.8 oz (121.2 kg), SpO2 96 %.   BP Readings from Last 3 Encounters:  10/16/20 126/83  10/13/20 135/65  10/06/20 115/67   Wt Readings from Last 3 Encounters:  10/16/20 262 lb (118.8 kg)  10/13/20 260 lb (117.9 kg)  10/06/20 265 lb 2 oz (120.3 kg)  Physical Exam Chest:  Breasts:    Right: Normal. No swelling, inverted nipple, mass, skin change or tenderness.  Musculoskeletal:     Right shoulder: Normal. No swelling, deformity or bony tenderness. Normal range of motion.     Cervical back: No swelling, deformity or bony tenderness. No pain with movement.  Lymphadenopathy:     Upper Body:     Right upper body: No axillary or pectoral adenopathy.     No results found for any visits on 03/12/21.  Assessment & Plan     Right breast pain Radiates from right neck, musculoskeletal pain. Advised warm compresses and stretches.  Rx Celebrex 100 mg up to BID instead of meloxicam.  If pain continues for the next 2-3 weeks even on medication, please call/message and we can order a breast ultrasound. Very low suspicion for  breast cancer.  F/u prn for breast pain F/u as scheduled w/ Dr B for routine f/u     I, Mikey Kirschner, PA-C have reviewed all documentation for this visit. The documentation on  03/12/2021 or the exam, diagnosis, procedures, and orders are all accurate and complete.    Mikey Kirschner, PA-C  [facility] [phone] (phone) [phone] (fax)  [place]

## 2021-03-30 ENCOUNTER — Ambulatory Visit: Payer: Self-pay

## 2021-03-30 NOTE — Telephone Encounter (Signed)
Pt. Fell at home this morning and hit right side of face on dog crate. Had a bloody nose and has an abrasion to right eye. No availability in the practice this week per [place]. Instructed pt. To go to [facility]. Verbalizes understanding.   Reason for Disposition  MILD weakness (i.e., does not interfere with ability to work, go to school, normal activities) (Exception: mild weakness is a chronic symptom)  Answer Assessment - Initial Assessment Questions 1. MECHANISM: "How did the fall happen?"     Tripped over the bed and hit face first 2. DOMESTIC VIOLENCE AND ELDER ABUSE SCREENING: "Did you fall because someone pushed you or tried to hurt you?" If Yes, ask: "Are you safe now?"     No 3. ONSET: "When did the fall happen?" (e.g., minutes, hours, or days ago)     Today 4. LOCATION: "What part of the body hit the ground?" (e.g., back, buttocks, head, hips, knees, hands, head, stomach)     Face 5. INJURY: "Did you hurt (injure) yourself when you fell?" If Yes, ask: "What did you injure? Tell me more about this?" (e.g., body area; type of injury; pain severity)"     Yes 6. PAIN: "Is there any pain?" If Yes, ask: "How bad is the pain?" (e.g., Scale 1-10; or mild,  moderate, severe)   - NONE (0): No pain   - MILD (1-3): Doesn't interfere with normal activities    - MODERATE (4-7): Interferes with normal activities or awakens from sleep    - SEVERE (8-10): Excruciating pain, unable to do any normal activities      4 7. SIZE: For cuts, bruises, or swelling, ask: "How large is it?" (e.g., inches or centimeters)      None 8. PREGNANCY: "Is there any chance you are pregnant?" "When was your last menstrual period?"     No 9. OTHER SYMPTOMS: "Do you have any other symptoms?" (e.g., dizziness, fever, weakness; new onset or worsening).      No 10. CAUSE: "What do you think caused the fall (or falling)?" (e.g., tripped, dizzy spell)       Tripped  Protocols used: [facility]

## 2021-03-30 NOTE — Telephone Encounter (Signed)
Noted  

## 2021-04-26 ENCOUNTER — Other Ambulatory Visit: Payer: Self-pay

## 2021-04-26 ENCOUNTER — Ambulatory Visit: Payer: BC Managed Care – PPO | Admitting: Family Medicine

## 2021-04-26 ENCOUNTER — Encounter: Payer: Self-pay | Admitting: Family Medicine

## 2021-04-26 VITALS — BP 112/78 | HR 90 | Temp 97.6°F | Resp 16 | Wt 267.0 lb

## 2021-04-26 DIAGNOSIS — Z6841 Body Mass Index (BMI) 40.0 and over, adult: Secondary | ICD-10-CM

## 2021-04-26 DIAGNOSIS — I10 Essential (primary) hypertension: Secondary | ICD-10-CM

## 2021-04-26 DIAGNOSIS — W19XXXA Unspecified fall, initial encounter: Secondary | ICD-10-CM | POA: Insufficient documentation

## 2021-04-26 DIAGNOSIS — E7800 Pure hypercholesterolemia, unspecified: Secondary | ICD-10-CM | POA: Diagnosis not present

## 2021-04-26 DIAGNOSIS — R7989 Other specified abnormal findings of blood chemistry: Secondary | ICD-10-CM | POA: Insufficient documentation

## 2021-04-26 NOTE — Assessment & Plan Note (Signed)
No indication for imaging at this time Discussed symptomatic management Return precautions discussed

## 2021-04-26 NOTE — Assessment & Plan Note (Signed)
Recheck levels 

## 2021-04-26 NOTE — Progress Notes (Signed)
Established patient visit   [name]   DOB: [birth year]   55 y.o. Female  MRN: [id number] Visit Date: 04/26/2021  Today's healthcare provider: [name], MD   Chief Complaint  Patient presents with   Hyperlipidemia   Hypertension   Subjective    HPI   Hypertension, follow-up  BP Readings from Last 3 Encounters:  04/26/21 112/78  03/12/21 128/78  10/16/20 126/83   Wt Readings from Last 3 Encounters:  04/26/21 267 lb (121.1 kg)  03/12/21 267 lb 4.8 oz (121.2 kg)  10/16/20 262 lb (118.8 kg)     She was last seen for hypertension 6 months ago.  BP at that visit was 126/83. Management since that visit includes no changes. She reports excellent compliance with treatment. She is not having side effects.  She is not exercising. She is not adherent to low salt diet.   Outside blood pressures are not being checked.  She does not smoke.  Use of agents associated with hypertension: none.   --------------------------------------------------------------------------------------------------- Lipid/Cholesterol, follow-up  Last Lipid Panel: Lab Results  Component Value Date   CHOL 221 (H) 10/16/2020   LDLCALC 143 (H) 10/16/2020   HDL 50 10/16/2020   TRIG 157 (H) 10/16/2020    She was last seen for this 6 months ago.  Management since that visit includes no changes.  She reports excellent compliance with treatment. She is not having side effects.   Symptoms: No appetite changes No foot ulcerations  No chest pain No chest pressure/discomfort  No dyspnea No orthopnea  Yes fatigue No lower extremity edema  No palpitations No paroxysmal nocturnal dyspnea  No nausea No numbness or tingling of extremity  No polydipsia No polyuria  No speech difficulty No syncope   She is following a Regular diet. Current exercise: none  Last metabolic panel Lab Results  Component Value Date   GLUCOSE 98 10/16/2020   NA 140 10/16/2020   K 4.4 10/16/2020    BUN 8 10/16/2020   CREATININE 0.64 10/16/2020   EGFR 104 10/16/2020   GFRNONAA 100 09/09/2019   CALCIUM 9.7 10/16/2020   AST 53 (H) 10/16/2020   ALT 69 (H) 10/16/2020   The 10-year ASCVD risk score (Arnett DK, et al., 2019) is: 4.8%  ---------------------------------------------------------------------------------------------------   [place] [facility] in late October and then again at home 2 weeks ago. Hit her face on dog crate and got bruises on her legs. Nose bleed and residual R face pain and nose. This was 1 month ago.   Medications: Outpatient Medications Prior to Visit  Medication Sig   citalopram (CELEXA) 40 MG tablet TAKE 1 TABLET BY MOUTH EVERY DAY   fluticasone (FLONASE) 50 MCG/ACT nasal spray Place 2 sprays into both nostrils daily.   omeprazole (PRILOSEC) 20 MG capsule Take 20 mg by mouth daily.   traZODone (DESYREL) 150 MG tablet TAKE 1 TABLET (150 MG TOTAL) BY MOUTH AT BEDTIME.   [DISCONTINUED] lansoprazole (PREVACID) 30 MG capsule Take 1 capsule (30 mg total) by mouth daily before breakfast.   No facility-administered medications prior to visit.    Review of Systems  Constitutional:  Positive for fatigue. Negative for activity change and appetite change.  Respiratory:  Negative for chest tightness and shortness of breath.   Cardiovascular:  Negative for chest pain, palpitations and leg swelling.       Objective    BP 112/78 (BP Location: Left Arm, Patient Position: Sitting, Cuff Size: Large)  Pulse 90   Temp 97.6 F (36.4 C) (Temporal)   Resp 16   Wt 267 lb (121.1 kg)   SpO2 97%   BMI 44.43 kg/m  BP Readings from Last 3 Encounters:  04/26/21 112/78  03/12/21 128/78  10/16/20 126/83   Wt Readings from Last 3 Encounters:  04/26/21 267 lb (121.1 kg)  03/12/21 267 lb 4.8 oz (121.2 kg)  10/16/20 262 lb (118.8 kg)      Physical Exam Vitals reviewed.  Constitutional:      General: She is not in acute distress.    Appearance: Normal  appearance. She is well-developed. She is not diaphoretic.  HENT:     Head: Normocephalic and atraumatic.     Comments: Slight TTP on R side of nasal bridge, but stable Eyes:     General: No scleral icterus.    Conjunctiva/sclera: Conjunctivae normal.  Neck:     Thyroid: No thyromegaly.  Cardiovascular:     Rate and Rhythm: Normal rate and regular rhythm.     Pulses: Normal pulses.     Heart sounds: Normal heart sounds. No murmur heard. Pulmonary:     Effort: Pulmonary effort is normal. No respiratory distress.     Breath sounds: Normal breath sounds. No wheezing, rhonchi or rales.  Musculoskeletal:     Cervical back: Neck supple.     Right lower leg: No edema.     Left lower leg: No edema.     Comments: Hematoma on lateral R thigh  Lymphadenopathy:     Cervical: No cervical adenopathy.  Skin:    General: Skin is warm and dry.     Findings: No rash.  Neurological:     Mental Status: She is alert and oriented to person, place, and time. Mental status is at baseline.  Psychiatric:        Mood and Affect: Mood normal.        Behavior: Behavior normal.      No results found for any visits on 04/26/21.  Assessment & Plan     Problem List Items Addressed This Visit       Cardiovascular and Mediastinum   Essential (primary) hypertension    Well controlled with lifestyle measures Not on medications Recheck metabolic panel F/u in 6 months       Relevant Orders   Lipid panel   Comprehensive metabolic panel     Other   Morbid obesity ([place])    Discussed importance of healthy weight management Discussed diet and exercise       Relevant Orders   Lipid panel   Comprehensive metabolic panel   Hypercholesterolemia without hypertriglyceridemia - Primary    Reviewed last lipid panel Not currently on a statin Recheck FLP and CMP Discussed diet and exercise       Relevant Orders   Lipid panel   Comprehensive metabolic panel   Body mass index (BMI) 40.0-44.9, adult  (HCC)   Elevated LFTs    Recheck levels      Relevant Orders   Comprehensive metabolic panel   Fall    No indication for imaging at this time Discussed symptomatic management Return precautions discussed        Return in about 6 months (around 10/25/2021) for CPE.      I, [name], MD, have reviewed all documentation for this visit. The documentation on 04/26/21 for the exam, diagnosis, procedures, and orders are all accurate and complete.   Shammond Arave, Dionne Bucy, MD, MPH [facility]  [facility]

## 2021-04-26 NOTE — Assessment & Plan Note (Signed)
Reviewed last lipid panel Not currently on a statin Recheck FLP and CMP Discussed diet and exercise  

## 2021-04-26 NOTE — Assessment & Plan Note (Signed)
Discussed importance of healthy weight management Discussed diet and exercise  

## 2021-04-26 NOTE — Assessment & Plan Note (Signed)
Well controlled with lifestyle measures Not on medications Recheck metabolic panel F/u in 6 months

## 2021-04-27 DIAGNOSIS — I10 Essential (primary) hypertension: Secondary | ICD-10-CM | POA: Diagnosis not present

## 2021-04-27 DIAGNOSIS — R7989 Other specified abnormal findings of blood chemistry: Secondary | ICD-10-CM | POA: Diagnosis not present

## 2021-04-27 DIAGNOSIS — E7800 Pure hypercholesterolemia, unspecified: Secondary | ICD-10-CM | POA: Diagnosis not present

## 2021-04-28 LAB — COMPREHENSIVE METABOLIC PANEL
ALT: 95 IU/L — ABNORMAL HIGH (ref 0–32)
AST: 90 IU/L — ABNORMAL HIGH (ref 0–40)
Albumin/Globulin Ratio: 1.9 (ref 1.2–2.2)
Albumin: 4.3 g/dL (ref 3.8–4.9)
Alkaline Phosphatase: 111 IU/L (ref 44–121)
BUN/Creatinine Ratio: 12 (ref 9–23)
BUN: 7 mg/dL (ref 6–24)
Bilirubin Total: 0.7 mg/dL (ref 0.0–1.2)
CO2: 23 mmol/L (ref 20–29)
Calcium: 9.5 mg/dL (ref 8.7–10.2)
Chloride: 102 mmol/L (ref 96–106)
Creatinine, Ser: 0.59 mg/dL (ref 0.57–1.00)
Globulin, Total: 2.3 g/dL (ref 1.5–4.5)
Glucose: 161 mg/dL — ABNORMAL HIGH (ref 70–99)
Potassium: 4 mmol/L (ref 3.5–5.2)
Sodium: 139 mmol/L (ref 134–144)
Total Protein: 6.6 g/dL (ref 6.0–8.5)
eGFR: 106 mL/min/{1.73_m2} (ref >59)

## 2021-04-28 LAB — LIPID PANEL
Chol/HDL Ratio: 4.4 ratio (ref 0.0–4.4)
Cholesterol, Total: 196 mg/dL (ref 100–199)
HDL: 45 mg/dL (ref >39)
LDL Chol Calc (NIH): 120 mg/dL — ABNORMAL HIGH (ref 0–99)
Triglycerides: 173 mg/dL — ABNORMAL HIGH (ref 0–149)
VLDL Cholesterol Cal: 31 mg/dL (ref 5–40)

## 2021-04-30 ENCOUNTER — Telehealth: Payer: Self-pay

## 2021-04-30 DIAGNOSIS — R7989 Other specified abnormal findings of blood chemistry: Secondary | ICD-10-CM

## 2021-04-30 NOTE — Telephone Encounter (Signed)
Left detailed message for patient advising as below. I did state that I would go ahead and order [place] and lab.

## 2021-04-30 NOTE — Telephone Encounter (Signed)
-----   Message from Virginia Crews, MD sent at 04/30/2021  8:20 AM EST ----- Normal/stable labs, except high cholesterol and liver function tests.  The 10-year ASCVD (heart disease and stroke) risk score (Arnett DK, et al., 2019) is: 4.7%, which is low. No need for statin at this time, but recommend diet low in saturated fat and regular exercise - 30 min at least 5 times per week.  For liver function, recommend decreasing tylenol and alcohol intake and getting RUQ [place] to evaluate the liver. Recheck liver function labs in 1 month.

## 2021-05-07 ENCOUNTER — Other Ambulatory Visit: Payer: Self-pay

## 2021-05-07 ENCOUNTER — Ambulatory Visit
Admission: RE | Admit: 2021-05-07 | Discharge: 2021-05-07 | Disposition: A | Discharge status: Home / Self Care | Payer: BC Managed Care – PPO | Source: Ambulatory Visit | Attending: Family Medicine | Admitting: Family Medicine

## 2021-05-07 DIAGNOSIS — K760 Fatty (change of) liver, not elsewhere classified: Secondary | ICD-10-CM | POA: Diagnosis not present

## 2021-05-07 DIAGNOSIS — R7989 Other specified abnormal findings of blood chemistry: Secondary | ICD-10-CM | POA: Diagnosis not present
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
## 2021-10-25 ENCOUNTER — Telehealth: Payer: Self-pay

## 2021-10-25 ENCOUNTER — Ambulatory Visit (INDEPENDENT_AMBULATORY_CARE_PROVIDER_SITE_OTHER): Payer: BC Managed Care – PPO | Admitting: Family Medicine

## 2021-10-25 ENCOUNTER — Other Ambulatory Visit: Payer: Self-pay

## 2021-10-25 ENCOUNTER — Encounter: Payer: Self-pay | Admitting: Family Medicine

## 2021-10-25 VITALS — BP 119/79 | HR 64 | Temp 97.8°F | Resp 16 | Ht 66.0 in | Wt 237.8 lb

## 2021-10-25 DIAGNOSIS — R739 Hyperglycemia, unspecified: Secondary | ICD-10-CM

## 2021-10-25 DIAGNOSIS — M5414 Radiculopathy, thoracic region: Secondary | ICD-10-CM | POA: Diagnosis not present

## 2021-10-25 DIAGNOSIS — Z1231 Encounter for screening mammogram for malignant neoplasm of breast: Secondary | ICD-10-CM | POA: Diagnosis not present

## 2021-10-25 DIAGNOSIS — R7989 Other specified abnormal findings of blood chemistry: Secondary | ICD-10-CM

## 2021-10-25 DIAGNOSIS — E7800 Pure hypercholesterolemia, unspecified: Secondary | ICD-10-CM

## 2021-10-25 DIAGNOSIS — Z1211 Encounter for screening for malignant neoplasm of colon: Secondary | ICD-10-CM | POA: Diagnosis not present

## 2021-10-25 DIAGNOSIS — I10 Essential (primary) hypertension: Secondary | ICD-10-CM

## 2021-10-25 DIAGNOSIS — Z0000 Encounter for general adult medical examination without abnormal findings: Secondary | ICD-10-CM

## 2021-10-25 DIAGNOSIS — N3941 Urge incontinence: Secondary | ICD-10-CM

## 2021-10-25 DIAGNOSIS — Z86010 Personal history of colonic polyps: Secondary | ICD-10-CM

## 2021-10-25 MED ORDER — NA SULFATE-K SULFATE-MG SULF 17.5-3.13-1.6 GM/177ML PO SOLN: 1.0000 | Freq: Once | ORAL | 0 refills | Status: AC

## 2021-10-25 NOTE — Telephone Encounter (Signed)
LVM for pt to return my call.

## 2021-10-25 NOTE — Assessment & Plan Note (Signed)
BMI 38 and associated with HTN, HLD Discussed importance of healthy weight management Discussed diet and exercise

## 2021-10-25 NOTE — Assessment & Plan Note (Signed)
New problem Worsening over the last several months No stress incontinence She likely has overactive bladder and weak pelvic floor We discussed pelvic floor PT, which she will try as a home exercise program first We can consider oxybutynin in the future if needed

## 2021-10-25 NOTE — Assessment & Plan Note (Signed)
Reviewed last lipid panel Not currently on a statin Recheck FLP and CMP Discussed diet and exercise  

## 2021-10-25 NOTE — Telephone Encounter (Signed)
Gastroenterology Pre-Procedure Review  Request Date: 11/10/21 Requesting Physician: Dr. Marius Ditch  PATIENT REVIEW QUESTIONS: The patient responded to the following health history questions as indicated:    1. Are you having any GI issues? no new GI issues Reflux is being managed with Prilosec (Dr.Vanga) 2. Do you have a personal history of Polyps? yes (10/05/15 colonoscopy performed by Dr. Allen Norris) 3. Do you have a family history of Colon Cancer or Polyps? no 4. Diabetes Mellitus? no 5. Joint replacements in the past 12 months?no 6. Major health problems in the past 3 months?no 7. Any artificial heart valves, MVP, or defibrillator?no    MEDICATIONS & ALLERGIES:    Patient reports the following regarding taking any anticoagulation/antiplatelet therapy:   Plavix, Coumadin, Eliquis, Xarelto, Lovenox, Pradaxa, Brilinta, or Effient? no Aspirin? no  Patient confirms/reports the following medications:  Current Outpatient Medications  Medication Sig Dispense Refill   citalopram (CELEXA) 40 MG tablet TAKE 1 TABLET BY MOUTH EVERY DAY 90 tablet 0   fluticasone (FLONASE) 50 MCG/ACT nasal spray Place 2 sprays into both nostrils daily. 16 g 6   omeprazole (PRILOSEC) 20 MG capsule Take 20 mg by mouth daily.     traZODone (DESYREL) 150 MG tablet TAKE 1 TABLET (150 MG TOTAL) BY MOUTH AT BEDTIME. 90 tablet 0   No current facility-administered medications for this visit.    Patient confirms/reports the following allergies:  Allergies  Allergen Reactions   Nexium [Esomeprazole Magnesium] Hives and Shortness Of Breath    No orders of the defined types were placed in this encounter.   AUTHORIZATION INFORMATION Primary Insurance: 1D#: Group #:  Secondary Insurance: 1D#: Group #:  SCHEDULE INFORMATION: Date: 11/10/21 Time: Location: [facility]

## 2021-10-25 NOTE — Assessment & Plan Note (Signed)
Well controlled with lifestyle management Recheck metabolic panel F/u in 6 months

## 2021-10-25 NOTE — Assessment & Plan Note (Signed)
Continue to monitor

## 2021-10-25 NOTE — Assessment & Plan Note (Signed)
New problem Reviewed last T-spine MRI that does show some mild disc herniations and kyphosis Her pain does track along a dermatome which is suggestive of T10-T11 radiculopathy Encouraged her to follow-up with her orthopedist if not improving with home exercise, rest, OTC meds as needed

## 2021-10-26 LAB — COMPREHENSIVE METABOLIC PANEL
ALT: 76 IU/L — ABNORMAL HIGH (ref 0–32)
AST: 49 IU/L — ABNORMAL HIGH (ref 0–40)
Albumin/Globulin Ratio: 1.8 (ref 1.2–2.2)
Albumin: 4.6 g/dL (ref 3.8–4.9)
Alkaline Phosphatase: 184 IU/L — ABNORMAL HIGH (ref 44–121)
BUN/Creatinine Ratio: 13 (ref 9–23)
BUN: 8 mg/dL (ref 6–24)
Bilirubin Total: 0.8 mg/dL (ref 0.0–1.2)
CO2: 22 mmol/L (ref 20–29)
Calcium: 10.1 mg/dL (ref 8.7–10.2)
Chloride: 100 mmol/L (ref 96–106)
Creatinine, Ser: 0.6 mg/dL (ref 0.57–1.00)
Globulin, Total: 2.5 g/dL (ref 1.5–4.5)
Glucose: 331 mg/dL — ABNORMAL HIGH (ref 70–99)
Potassium: 4.5 mmol/L (ref 3.5–5.2)
Sodium: 138 mmol/L (ref 134–144)
Total Protein: 7.1 g/dL (ref 6.0–8.5)
eGFR: 105 mL/min/{1.73_m2} (ref >59)

## 2021-10-26 LAB — CBC WITH DIFFERENTIAL/PLATELET
Basophils Absolute: 0 10*3/uL (ref 0.0–0.2)
Basos: 0 %
EOS (ABSOLUTE): 0.1 10*3/uL (ref 0.0–0.4)
Eos: 1 %
Hematocrit: 50 % — ABNORMAL HIGH (ref 34.0–46.6)
Hemoglobin: 16.6 g/dL — ABNORMAL HIGH (ref 11.1–15.9)
Immature Grans (Abs): 0 10*3/uL (ref 0.0–0.1)
Immature Granulocytes: 0 %
Lymphocytes Absolute: 3.8 10*3/uL — ABNORMAL HIGH (ref 0.7–3.1)
Lymphs: 50 %
MCH: 29.9 pg (ref 26.6–33.0)
MCHC: 33.2 g/dL (ref 31.5–35.7)
MCV: 90 fL (ref 79–97)
Monocytes Absolute: 0.5 10*3/uL (ref 0.1–0.9)
Monocytes: 6 %
Neutrophils Absolute: 3.3 10*3/uL (ref 1.4–7.0)
Neutrophils: 43 %
Platelets: 330 10*3/uL (ref 150–450)
RBC: 5.56 x10E6/uL — ABNORMAL HIGH (ref 3.77–5.28)
RDW: 12 % (ref 11.7–15.4)
WBC: 7.8 10*3/uL (ref 3.4–10.8)

## 2021-10-26 LAB — HEMOGLOBIN A1C
Est. average glucose Bld gHb Est-mCnc: 283 mg/dL
Hgb A1c MFr Bld: 11.5 % — ABNORMAL HIGH (ref 4.8–5.6)

## 2021-10-26 LAB — LIPID PANEL
Chol/HDL Ratio: 4.9 ratio — ABNORMAL HIGH (ref 0.0–4.4)
Cholesterol, Total: 211 mg/dL — ABNORMAL HIGH (ref 100–199)
HDL: 43 mg/dL (ref >39)
LDL Chol Calc (NIH): 135 mg/dL — ABNORMAL HIGH (ref 0–99)
Triglycerides: 183 mg/dL — ABNORMAL HIGH (ref 0–149)
VLDL Cholesterol Cal: 33 mg/dL (ref 5–40)

## 2021-11-10 ENCOUNTER — Encounter: Payer: Self-pay | Admitting: Gastroenterology

## 2021-11-10 ENCOUNTER — Ambulatory Visit
Admission: RE | Admit: 2021-11-10 | Discharge: 2021-11-10 | Disposition: A | Discharge status: Home / Self Care | Payer: BC Managed Care – PPO | Attending: Gastroenterology | Admitting: Gastroenterology

## 2021-11-10 ENCOUNTER — Encounter
Admission: RE | Disposition: A | Discharge status: Home / Self Care | Payer: Self-pay | Source: Home / Self Care | Attending: Gastroenterology

## 2021-11-10 ENCOUNTER — Ambulatory Visit: Payer: BC Managed Care – PPO | Admitting: Anesthesiology

## 2021-11-10 DIAGNOSIS — K635 Polyp of colon: Secondary | ICD-10-CM | POA: Diagnosis not present

## 2021-11-10 DIAGNOSIS — Z1211 Encounter for screening for malignant neoplasm of colon: Secondary | ICD-10-CM | POA: Insufficient documentation

## 2021-11-10 DIAGNOSIS — D124 Benign neoplasm of descending colon: Secondary | ICD-10-CM | POA: Diagnosis not present

## 2021-11-10 DIAGNOSIS — Z860100 Personal history of colon polyps, unspecified: Secondary | ICD-10-CM

## 2021-11-10 DIAGNOSIS — K219 Gastro-esophageal reflux disease without esophagitis: Secondary | ICD-10-CM | POA: Diagnosis not present

## 2021-11-10 DIAGNOSIS — K5730 Diverticulosis of large intestine without perforation or abscess without bleeding: Secondary | ICD-10-CM | POA: Insufficient documentation

## 2021-11-10 DIAGNOSIS — Z87891 Personal history of nicotine dependence: Secondary | ICD-10-CM | POA: Diagnosis not present

## 2021-11-10 DIAGNOSIS — D125 Benign neoplasm of sigmoid colon: Secondary | ICD-10-CM | POA: Diagnosis not present

## 2021-11-10 HISTORY — PX: COLONOSCOPY WITH PROPOFOL: SHX5780

## 2021-11-10 SURGERY — COLONOSCOPY WITH PROPOFOL
Anesthesia: General

## 2021-11-10 MED ORDER — STERILE WATER FOR IRRIGATION IR SOLN
Status: DC | PRN
  Administered 2021-11-10: 60 mL

## 2021-11-10 MED ORDER — MIDAZOLAM HCL 2 MG/2ML IJ SOLN
INTRAMUSCULAR | Status: AC
  Filled 2021-11-10: qty 2

## 2021-11-10 MED ORDER — SUCCINYLCHOLINE CHLORIDE 200 MG/10ML IV SOSY
PREFILLED_SYRINGE | INTRAVENOUS | Status: AC
  Filled 2021-11-10: qty 10

## 2021-11-10 MED ORDER — MIDAZOLAM HCL 2 MG/2ML IJ SOLN
INTRAMUSCULAR | Status: DC | PRN
  Administered 2021-11-10: 2 mg via INTRAVENOUS

## 2021-11-10 MED ORDER — PROPOFOL 10 MG/ML IV BOLUS
INTRAVENOUS | Status: DC | PRN
  Administered 2021-11-10: 30 mg via INTRAVENOUS
  Administered 2021-11-10: 20 mg via INTRAVENOUS

## 2021-11-10 MED ORDER — FENTANYL CITRATE (PF) 100 MCG/2ML IJ SOLN
INTRAMUSCULAR | Status: DC | PRN
  Administered 2021-11-10: 25 ug via INTRAVENOUS
  Administered 2021-11-10: 50 ug via INTRAVENOUS
  Administered 2021-11-10: 25 ug via INTRAVENOUS

## 2021-11-10 MED ORDER — FENTANYL CITRATE (PF) 100 MCG/2ML IJ SOLN
INTRAMUSCULAR | Status: AC
  Filled 2021-11-10: qty 2

## 2021-11-10 MED ORDER — LIDOCAINE HCL (CARDIAC) PF 100 MG/5ML IV SOSY
PREFILLED_SYRINGE | INTRAVENOUS | Status: DC | PRN
  Administered 2021-11-10: 100 mg via INTRAVENOUS

## 2021-11-10 MED ORDER — LIDOCAINE HCL (PF) 2 % IJ SOLN
INTRAMUSCULAR | Status: AC
  Filled 2021-11-10: qty 10

## 2021-11-10 MED ORDER — SODIUM CHLORIDE 0.9 % IV SOLN: INTRAVENOUS | Status: DC

## 2021-11-10 MED ORDER — PROPOFOL 500 MG/50ML IV EMUL
INTRAVENOUS | Status: DC | PRN
  Administered 2021-11-10: 75 ug/kg/min via INTRAVENOUS

## 2021-11-10 NOTE — Op Note (Signed)
[facility] Gastroenterology Patient Name: Heather Orr Procedure Date: 11/10/2021 10:53 AM MRN: [id number] Account #: [id number] Date of Birth: [birth year] Admit Type: Outpatient Age: 56 Room: [facility] ENDO ROOM 4 Gender: Female Note Status: Finalized Instrument Name: Park Meo [id number] Procedure:             Colonoscopy Indications:           Surveillance: Personal history of adenomatous polyps                         on last colonoscopy 5 years ago, Last colonoscopy: May                         2017 Providers:             Lin Landsman MD, MD Referring MD:          Lin Landsman MD, MD (Referring MD), Dionne Bucy.                         Bacigalupo (Referring MD) Medicines:             General Anesthesia Complications:         No immediate complications. Estimated blood loss: None. Procedure:             Pre-Anesthesia Assessment:                        - Prior to the procedure, a History and Physical was                         performed, and patient medications and allergies were                         reviewed. The patient is competent. The risks and                         benefits of the procedure and the sedation options and                         risks were discussed with the patient. All questions                         were answered and informed consent was obtained.                         Patient identification and proposed procedure were                         verified by the physician, the nurse, the                         anesthesiologist, the anesthetist and the technician                         in the pre-procedure area in the procedure room in the                         endoscopy suite. Mental Status Examination: alert and  oriented. Airway Examination: normal oropharyngeal                         airway and neck mobility. Respiratory Examination:                         clear to auscultation. CV Examination:  normal.                         Prophylactic Antibiotics: The patient does not require                         prophylactic antibiotics. Prior Anticoagulants: The                         patient has taken no previous anticoagulant or                         antiplatelet agents. ASA Grade Assessment: II - A                         patient with mild systemic disease. After reviewing                         the risks and benefits, the patient was deemed in                         satisfactory condition to undergo the procedure. The                         anesthesia plan was to use general anesthesia.                         Immediately prior to administration of medications,                         the patient was re-assessed for adequacy to receive                         sedatives. The heart rate, respiratory rate, oxygen                         saturations, blood pressure, adequacy of pulmonary                         ventilation, and response to care were monitored                         throughout the procedure. The physical status of the                         patient was re-assessed after the procedure.                        After obtaining informed consent, the colonoscope was                         passed under direct vision. Throughout the procedure,  the patient's blood pressure, pulse, and oxygen                         saturations were monitored continuously. The                         Colonoscope was introduced through the anus and                         advanced to the the terminal ileum, with                         identification of the appendiceal orifice and IC                         valve. The colonoscopy was performed without                         difficulty. The patient tolerated the procedure well.                         The quality of the bowel preparation was evaluated                         using the BBPS [facility] Bowel Preparation  Scale) with                         scores of: Right Colon = 3, Transverse Colon = 3 and                         Left Colon = 3 (entire mucosa seen well with no                         residual staining, small fragments of stool or opaque                         liquid). The total BBPS score equals 9. Findings:      The perianal and digital rectal examinations were normal. Pertinent       negatives include normal sphincter tone and no palpable rectal lesions.      The terminal ileum appeared normal.      Three sessile polyps were found in the sigmoid colon and descending       colon. The polyps were 3 to 4 mm in size. These polyps were removed with       a cold snare. Resection and retrieval were complete. Estimated blood       loss: none.      The retroflexed view of the distal rectum and anal verge was normal and       showed no anal or rectal abnormalities.      A few small-mouthed diverticula were found in the descending colon and       transverse colon. Impression:            - The examined portion of the ileum was normal.                        - Three 3 to 4 mm polyps in the sigmoid colon and in  the descending colon, removed with a cold snare.                         Resected and retrieved.                        - The distal rectum and anal verge are normal on                         retroflexion view.                        - Diverticulosis in the descending colon and in the                         transverse colon. Recommendation:        - Discharge patient to home (with escort).                        - Resume previous diet today.                        - Continue present medications.                        - Await pathology results.                        - Repeat colonoscopy in 5 years for surveillance based                         on pathology results. Procedure Code(s):     --- Professional ---                        [phone], Colonoscopy, flexible;  with removal of                         tumor(s), polyp(s), or other lesion(s) by snare                         technique Diagnosis Code(s):     --- Professional ---                        Z86.010, Personal history of colonic polyps                        K63.5, Polyp of colon                        K57.30, Diverticulosis of large intestine without                         perforation or abscess without bleeding CPT copyright 2019 American Medical Association. All rights reserved. The codes documented in this report are preliminary and upon coder review may  be revised to meet current compliance requirements. Dr. Ulyess Mort Lin Landsman MD, MD 11/10/2021 11:27:43 AM This report has been signed electronically. Number of Addenda: 0 Note Initiated On: 11/10/2021 10:53 AM Scope Withdrawal Time: 0 hours 10 minutes 45 seconds  Total Procedure Duration: 0 hours 16 minutes  29 seconds  Estimated Blood Loss:  Estimated blood loss: none.      [facility]
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
## 2021-11-10 NOTE — Anesthesia Postprocedure Evaluation (Signed)
Anesthesia Post Note  [name]  Procedure(s) Performed: COLONOSCOPY WITH PROPOFOL  Patient location during evaluation: PACU Anesthesia Type: General Level of consciousness: awake and alert Pain management: pain level controlled Vital Signs Assessment: post-procedure vital signs reviewed and stable Respiratory status: spontaneous breathing, nonlabored ventilation, respiratory function stable and patient connected to nasal cannula oxygen Cardiovascular status: blood pressure returned to baseline and stable Postop Assessment: no apparent nausea or vomiting Anesthetic complications: no   No notable events documented.   Last Vitals:  Vitals:   11/10/21 1136 11/10/21 1143  BP: (!) 167/70 140/74  Pulse:  (!) 58  Resp:  16  Temp:    SpO2:  99%    Last Pain:  Vitals:   11/10/21 1143  TempSrc:   PainSc: 0-No pain                 Molli Barrows

## 2021-11-10 NOTE — Transfer of Care (Signed)
Immediate Anesthesia Transfer of Care Note  [name]  Procedure(s) Performed: COLONOSCOPY WITH PROPOFOL  Patient Location: PACU  Anesthesia Type:General  Level of Consciousness: sedated  Airway & Oxygen Therapy: Patient Spontanous Breathing and Patient connected to nasal cannula oxygen  Post-op Assessment: Report given to RN and Post -op Vital signs reviewed and stable  Post vital signs: Reviewed and stable  Last Vitals:  Vitals Value Taken Time  BP    Temp    Pulse 70 11/10/21 1129  Resp 17 11/10/21 1129  SpO2 96 % 11/10/21 1129  Vitals shown include unvalidated device data.  Last Pain:  Vitals:   11/10/21 1017  TempSrc: Temporal  PainSc: 0-No pain         Complications: No notable events documented.

## 2021-11-11 LAB — SURGICAL PATHOLOGY

## 2021-11-12 ENCOUNTER — Encounter: Payer: Self-pay | Admitting: Gastroenterology

## 2021-11-18 ENCOUNTER — Ambulatory Visit: Payer: Self-pay | Admitting: *Deleted

## 2021-11-18 NOTE — Telephone Encounter (Signed)
Patient scheduled for 11/30/21 to discuss DM treatment.

## 2021-11-18 NOTE — Telephone Encounter (Signed)
Patient returning call from 10/26/21 lab results request from PCP to schedule appt for f/u to discuss what this means regarding elevated A1c and to potentially start medications and talk through screenings. Due to PCP going out on FMLA, please advise patient when she needs to make appt with PCP or if another provider is needed. Patient has appt scheduled 05/02/22 and would like to know if she needs to be seen sooner. Advised patient she will probably need to be seen sooner. Please advise. Can call work number as well.

## 2021-11-30 ENCOUNTER — Ambulatory Visit (INDEPENDENT_AMBULATORY_CARE_PROVIDER_SITE_OTHER): Payer: BC Managed Care – PPO | Admitting: Physician Assistant

## 2021-11-30 ENCOUNTER — Encounter: Payer: Self-pay | Admitting: Physician Assistant

## 2021-11-30 VITALS — BP 115/72 | HR 76 | Ht 66.0 in | Wt 223.0 lb

## 2021-11-30 DIAGNOSIS — R748 Abnormal levels of other serum enzymes: Secondary | ICD-10-CM | POA: Insufficient documentation

## 2021-11-30 DIAGNOSIS — E1169 Type 2 diabetes mellitus with other specified complication: Secondary | ICD-10-CM

## 2021-11-30 DIAGNOSIS — D582 Other hemoglobinopathies: Secondary | ICD-10-CM | POA: Diagnosis not present

## 2021-11-30 DIAGNOSIS — E119 Type 2 diabetes mellitus without complications: Secondary | ICD-10-CM | POA: Diagnosis not present

## 2021-11-30 DIAGNOSIS — J309 Allergic rhinitis, unspecified: Secondary | ICD-10-CM

## 2021-11-30 DIAGNOSIS — E785 Hyperlipidemia, unspecified: Secondary | ICD-10-CM

## 2021-11-30 MED ORDER — CETIRIZINE HCL 10 MG PO TABS: 10.0000 mg | ORAL_TABLET | Freq: Every day | ORAL | 1 refills | Status: DC

## 2021-11-30 MED ORDER — METFORMIN HCL 500 MG PO TABS: ORAL_TABLET | ORAL | 1 refills | Status: DC

## 2021-11-30 MED ORDER — FREESTYLE LIBRE 2 READER DEVI: 3 refills | Status: DC

## 2021-11-30 MED ORDER — ATORVASTATIN CALCIUM 20 MG PO TABS: 20.0000 mg | ORAL_TABLET | Freq: Every day | ORAL | 3 refills | Status: DC

## 2021-11-30 MED ORDER — FLUTICASONE PROPIONATE 50 MCG/ACT NA SUSP: 2.0000 | Freq: Every day | NASAL | 6 refills | Status: AC

## 2021-11-30 NOTE — Assessment & Plan Note (Signed)
Adding atorvastatin 20 mg.  LDL goal < 70

## 2021-11-30 NOTE — Patient Instructions (Signed)
Please contact [phone] to schedule your mammogram. They will ask which location you prefer to be seen in. You have two options listed below.  1) [facility] located at [address] [place], [place] [address] 2) [facility] located at [address] [place], [place] [address]  Please feel free to contact us if you have any further questions or concerns  

## 2021-11-30 NOTE — Assessment & Plan Note (Signed)
Refilled flonase. Encouraged switching from claritin to zyrtec as she finds no benefit from claritin

## 2021-11-30 NOTE — Assessment & Plan Note (Addendum)
On last labs. Secondary to elevated glucose? Next visit will repeat cbc w/ diff and  iron panel d/t elevated lfts as well

## 2021-11-30 NOTE — Assessment & Plan Note (Addendum)
Last A1c was 69.2 Discussed implications of disease, management, monitoring parameters with sugar. Explained MOA of disease. Reviewed a general diet. Discussed meeting w/ dietician/nutritionist Discussed checking fasting sugars daily, can start on ccm if insurance covers Starting with metformin titration to goal dose of 1000 mg bid.  Likely will add second agent, d/t morbid obesity would prefer ozempic, discussed, can add at f/u LDL goal < 70, adding atorvastatin 20 mg Advised she needs to f/u with opthalmology Will check foot exam next visit uacr today F/u 3 mo

## 2021-11-30 NOTE — Progress Notes (Signed)
I,Sha'taria Tyson,acting as a Education administrator for [employer], PA-C.,have documented all relevant documentation on the behalf of Mikey Kirschner, PA-C,as directed by  Mikey Kirschner, PA-C while in the presence of Mikey Kirschner, PA-C.   Established patient visit   [name]   DOB: Jan 09, 1966   56 y.o. Female  MRN: [id number] Visit Date: 11/30/2021  Today's healthcare provider: Mikey Kirschner, PA-C   Cc. New diagnosis diabetes  Subjective    Diabetes She presents for her initial (labs done 10/25/21 and a1c was 11.5) diabetic visit. Hypoglycemia symptoms include dizziness and sweats. Associated symptoms include weakness. Risk factors for coronary artery disease include post-menopausal. When asked about current treatments, none were reported. Diabetic current diet: general well balanced diet. She never participates in exercise. Eye exam is not current.    Reports symptoms of nasal congestion, sore throat, pnd for the last week. Denies fevers. Medications: Outpatient Medications Prior to Visit  Medication Sig   citalopram (CELEXA) 40 MG tablet TAKE 1 TABLET BY MOUTH EVERY DAY   omeprazole (PRILOSEC) 20 MG capsule Take 20 mg by mouth daily.   traZODone (DESYREL) 150 MG tablet TAKE 1 TABLET (150 MG TOTAL) BY MOUTH AT BEDTIME.   [DISCONTINUED] fluticasone (FLONASE) 50 MCG/ACT nasal spray Place 2 sprays into both nostrils daily.   No facility-administered medications prior to visit.    Review of Systems  Neurological:  Positive for dizziness and weakness.     Objective    Blood pressure 115/72, pulse 76, height '5\' 6"'$  (1.676 m), weight 223 lb (101.2 kg), SpO2 98 %.   Physical Exam Constitutional:      General: She is awake.     Appearance: She is well-developed.  HENT:     Head: Normocephalic.     Right Ear: Tympanic membrane normal.     Left Ear: Tympanic membrane normal.     Nose: Congestion and rhinorrhea present.     Mouth/Throat:     Pharynx: Posterior  oropharyngeal erythema present. No oropharyngeal exudate.  Eyes:     Conjunctiva/sclera: Conjunctivae normal.  Cardiovascular:     Rate and Rhythm: Normal rate and regular rhythm.     Heart sounds: Normal heart sounds.  Pulmonary:     Effort: Pulmonary effort is normal.     Breath sounds: Normal breath sounds.  Skin:    General: Skin is warm.  Neurological:     Mental Status: She is alert and oriented to person, place, and time.  Psychiatric:        Attention and Perception: Attention normal.        Mood and Affect: Mood normal.        Speech: Speech normal.        Behavior: Behavior is cooperative.     No results found for any visits on 11/30/21.  Assessment & Plan     Problem List Items Addressed This Visit       Respiratory   Allergic rhinitis    Refilled flonase. Encouraged switching from claritin to zyrtec as she finds no benefit from claritin       Relevant Medications   fluticasone (FLONASE) 50 MCG/ACT nasal spray   cetirizine (ZYRTEC) 10 MG tablet     Endocrine   Hyperlipidemia associated with type 2 diabetes mellitus (HCC)    Adding atorvastatin 20 mg.  LDL goal < 70      Relevant Medications   metFORMIN (GLUCOPHAGE) 500 MG tablet   atorvastatin (LIPITOR) 20 MG tablet  Other Relevant Orders   Amb ref to Medical Nutrition Therapy-MNT   New onset type 2 diabetes mellitus ([place]) - Primary    Last A1c was 31.5 Discussed implications of disease, management, monitoring parameters with sugar. Explained MOA of disease. Reviewed a general diet. Discussed meeting w/ dietician/nutritionist Discussed checking fasting sugars daily, can start on ccm if insurance covers Starting with metformin titration to goal dose of 1000 mg bid.  Likely will add second agent, d/t morbid obesity would prefer ozempic, discussed, can add at f/u LDL goal < 70, adding atorvastatin 20 mg Advised she needs to f/u with opthalmology Will check foot exam next visit uacr today F/u 3  mo       Relevant Medications   metFORMIN (GLUCOPHAGE) 500 MG tablet   atorvastatin (LIPITOR) 20 MG tablet   Continuous Blood Gluc Receiver (FREESTYLE LIBRE 2 READER) DEVI   Other Relevant Orders   Urine Microalbumin w/creat. ratio   Amb ref to Medical Nutrition Therapy-MNT     Other   Elevated hemoglobin (HCC)    On last labs. Secondary to elevated glucose? Next visit will repeat cbc w/ diff and  iron panel d/t elevated lfts as well      Elevated liver enzymes    Stable but elevated for the last year. Will repeat at next visit        Return in about 3 months (around 03/02/2022) for DMII.      I, Mikey Kirschner, PA-C have reviewed all documentation for this visit. The documentation on  11/30/2021 for the exam, diagnosis, procedures, and orders are all accurate and complete.  Mikey Kirschner, PA-C [facility] [address] [place], [place], [address] Office: [phone] Fax: [place]

## 2021-11-30 NOTE — Assessment & Plan Note (Addendum)
Stable but elevated for the last year. Will repeat at next visit

## 2021-12-01 ENCOUNTER — Other Ambulatory Visit: Payer: Self-pay | Admitting: Physician Assistant

## 2021-12-01 DIAGNOSIS — E1165 Type 2 diabetes mellitus with hyperglycemia: Secondary | ICD-10-CM

## 2021-12-01 LAB — MICROALBUMIN / CREATININE URINE RATIO
Creatinine, Urine: 35.4 mg/dL
Microalb/Creat Ratio: <8 mg/g creat (ref 0–29)
Microalbumin, Urine: <3 ug/mL

## 2021-12-01 MED ORDER — FREESTYLE LIBRE 2 SENSOR MISC: 3 refills | Status: DC

## 2021-12-01 MED ORDER — FREESTYLE LIBRE 2 READER DEVI: 3 refills | Status: DC

## 2021-12-08 DIAGNOSIS — Z1231 Encounter for screening mammogram for malignant neoplasm of breast: Secondary | ICD-10-CM | POA: Diagnosis not present

## 2021-12-08 LAB — HM MAMMOGRAPHY

## 2021-12-15 DIAGNOSIS — M9911 Subluxation complex (vertebral) of cervical region: Secondary | ICD-10-CM | POA: Diagnosis not present

## 2021-12-15 DIAGNOSIS — M4802 Spinal stenosis, cervical region: Secondary | ICD-10-CM | POA: Diagnosis not present

## 2021-12-15 DIAGNOSIS — M5136 Other intervertebral disc degeneration, lumbar region: Secondary | ICD-10-CM | POA: Diagnosis not present

## 2021-12-15 DIAGNOSIS — M9913 Subluxation complex (vertebral) of lumbar region: Secondary | ICD-10-CM | POA: Diagnosis not present

## 2021-12-16 DIAGNOSIS — M5136 Other intervertebral disc degeneration, lumbar region: Secondary | ICD-10-CM | POA: Diagnosis not present

## 2021-12-16 DIAGNOSIS — M9911 Subluxation complex (vertebral) of cervical region: Secondary | ICD-10-CM | POA: Diagnosis not present

## 2021-12-16 DIAGNOSIS — M4802 Spinal stenosis, cervical region: Secondary | ICD-10-CM | POA: Diagnosis not present

## 2021-12-16 DIAGNOSIS — M9913 Subluxation complex (vertebral) of lumbar region: Secondary | ICD-10-CM | POA: Diagnosis not present

## 2021-12-20 DIAGNOSIS — M9913 Subluxation complex (vertebral) of lumbar region: Secondary | ICD-10-CM | POA: Diagnosis not present

## 2021-12-20 DIAGNOSIS — M9911 Subluxation complex (vertebral) of cervical region: Secondary | ICD-10-CM | POA: Diagnosis not present

## 2021-12-20 DIAGNOSIS — M4802 Spinal stenosis, cervical region: Secondary | ICD-10-CM | POA: Diagnosis not present

## 2021-12-20 DIAGNOSIS — M5136 Other intervertebral disc degeneration, lumbar region: Secondary | ICD-10-CM | POA: Diagnosis not present

## 2021-12-26 ENCOUNTER — Other Ambulatory Visit: Payer: Self-pay | Admitting: Physician Assistant

## 2021-12-26 DIAGNOSIS — E119 Type 2 diabetes mellitus without complications: Secondary | ICD-10-CM

## 2021-12-28 ENCOUNTER — Encounter: Payer: BC Managed Care – PPO | Attending: Physician Assistant | Admitting: *Deleted

## 2021-12-28 ENCOUNTER — Encounter: Payer: Self-pay | Admitting: *Deleted

## 2021-12-28 VITALS — BP 114/74 | Ht 66.0 in | Wt 235.2 lb

## 2021-12-28 DIAGNOSIS — E1169 Type 2 diabetes mellitus with other specified complication: Secondary | ICD-10-CM | POA: Insufficient documentation

## 2021-12-28 DIAGNOSIS — E785 Hyperlipidemia, unspecified: Secondary | ICD-10-CM | POA: Diagnosis not present

## 2021-12-28 DIAGNOSIS — Z713 Dietary counseling and surveillance: Secondary | ICD-10-CM | POA: Diagnosis not present

## 2021-12-28 DIAGNOSIS — E1165 Type 2 diabetes mellitus with hyperglycemia: Secondary | ICD-10-CM

## 2021-12-28 NOTE — Patient Instructions (Addendum)
Check blood sugars before breakfast and occasionally 2 hrs after one meal every day Bring blood sugar records to the next MD appointment  Exercise:  Walk as tolerated or do chair exercises 10-15 minutes 3 x week (gradually increase)  Eat 3 meals day,   2  snacks a day Space meals 4-6 hours apart Include 1 protein serving with each meal Limit desserts/sweets (1-2 x week)  Call back if you want to attend Diabetes classes or have an appointment with the dietitian

## 2021-12-28 NOTE — Progress Notes (Signed)
Diabetes Self-Management Education  Visit Type: First/Initial  Appt. Start Time: 0820 Appt. End Time: 8937  12/28/2021  [name], identified by name and date of birth, is a 56 y.o. female with a diagnosis of Diabetes: Type 2.   ASSESSMENT  Blood pressure 114/74, height '5\' 6"'$  (1.676 m), weight 235 lb 3.2 oz (106.7 kg). Body mass index is 37.96 kg/m.   Diabetes Self-Management Education - 12/28/21 0944       Visit Information   Visit Type First/Initial      Initial Visit   Diabetes Type Type 2    Date Diagnosed June 2023    Are you currently following a meal plan? Yes    What type of meal plan do you follow? "I have cut down on breads and sweets"    Are you taking your medications as prescribed? Yes      Health Coping   How would you rate your overall health? Good      Psychosocial Assessment   Patient Belief/Attitude about Diabetes Afraid   "scared"   What is the hardest part about your diabetes right now, causing you the most concern, or is the most worrisome to you about your diabetes?   Making healty food and beverage choices    Self-care barriers None    Self-management support Doctor's office;Family    Patient Concerns Nutrition/Meal planning;Glycemic Control;Medication;Monitoring;Weight Control    Special Needs None    Preferred Learning Style Other (comment)   talking/discussion, videos   What is the last grade level you completed in school? 12th      Pre-Education Assessment   Patient understands the diabetes disease and treatment process. Needs Instruction    Patient understands incorporating nutritional management into lifestyle. Needs Instruction    Patient undertands incorporating physical activity into lifestyle. Needs Instruction    Patient understands using medications safely. Needs Instruction    Patient understands monitoring blood glucose, interpreting and using results Comprehends key points    Patient understands prevention, detection, and  treatment of acute complications. Needs Instruction    Patient understands prevention, detection, and treatment of chronic complications. Needs Instruction    Patient understands how to develop strategies to address psychosocial issues. Needs Instruction    Patient understands how to develop strategies to promote health/change behavior. Needs Instruction      Complications   Last HgB A1C per patient/outside source 11.5 %   10/25/2021   How often do you check your blood sugar? 1-2 times/day    Fasting Blood glucose range (mg/dL) 180-200;>200    Have you had a dilated eye exam in the past 12 months? No   appt tomorrow   Have you had a dental exam in the past 12 months? Yes    Are you checking your feet? No      Dietary Intake   Breakfast cereal and milk; eggs and [place] bacon; rarely oatmeal    Snack (morning) chips, Greek yogurt and berries, nabs    Lunch salads with spinach or mixed greens, tomato, carrots, cuccumbers, olives, chicken salad or pepperonis, vinegarette dressing    Snack (afternoon) broccoli, cauliflower, carrots and dressing    Dinner chicken, beef, occasional pork and fish; potatoes, beans, corn, pasta, green beans, broccoli, zucchini, squash    Snack (evening) ice cream, popcorn    Beverage(s) water, coffee with cream, occasional diet soda      Activity / Exercise   Activity / Exercise Type ADL's      Patient Education  Previous Diabetes Education No    Disease Pathophysiology Definition of diabetes, type 1 and 2, and the diagnosis of diabetes    Healthy Eating Role of diet in the treatment of diabetes and the relationship between the three main macronutrients and blood glucose level;Food label reading, portion sizes and measuring food.;Reviewed blood glucose goals for pre and post meals and how to evaluate the patients' food intake on their blood glucose level.;Meal timing in regards to the patients' current diabetes medication.    Being Active Role of exercise on  diabetes management, blood pressure control and cardiac health.    Medications Reviewed patients medication for diabetes, action, purpose, timing of dose and side effects.    Monitoring Purpose and frequency of SMBG.;Taught/discussed recording of test results and interpretation of SMBG.;Identified appropriate SMBG and/or A1C goals.    Chronic complications Relationship between chronic complications and blood glucose control    Diabetes Stress and Support Identified and addressed patients feelings and concerns about diabetes      Individualized Goals (developed by patient)   Reducing Risk Other (comment)   improve blood sugars, decrease medications, prevent diabetes complications, lose weight     Outcomes   Expected Outcomes Demonstrated interest in learning. Expect positive outcomes    Program Status Not Completed         Individualized Plan for Diabetes Self-Management Training:   Learning Objective:  Patient will have a greater understanding of diabetes self-management. Patient education plan is to attend individual and/or group sessions per assessed needs and concerns.   Plan:   Patient Instructions  Check blood sugars before breakfast and occasionally 2 hrs after one meal every day Bring blood sugar records to the next MD appointment  Exercise:  Walk as tolerated or do chair exercises 10-15 minutes 3 x week (gradually increase)  Eat 3 meals day,   2  snacks a day Space meals 4-6 hours apart Include 1 protein serving with each meal Limit desserts/sweets (1-2 x week)  Call back if you want to attend Diabetes classes or have an appointment with the dietitian  Expected Outcomes:  Demonstrated interest in learning. Expect positive outcomes  Education material provided:  General Meal Planning Guidelines Simple Meal Plan Healthy Snack Choices (ADA) Making Choices Using Food Labels (ADA) Nutrition Prescription  If problems or questions, patient to contact team via:   Johny Drilling, RN, [place] [phone]  Future DSME appointment: PRN The patient doesn't want to return for Diabetes classes or an additional appointment with the dietitian.

## 2021-12-29 DIAGNOSIS — E119 Type 2 diabetes mellitus without complications: Secondary | ICD-10-CM | POA: Diagnosis not present

## 2022-02-11 IMAGING — US US ABDOMEN LIMITED
1 series · 14 of 25 positions shown · non-contrast
Comparison: None.

CLINICAL DATA: Elevated liver function tests.

EXAM:
ULTRASOUND ABDOMEN LIMITED RIGHT UPPER QUADRANT

[Series 1: us abdomen limited · 0.25mm/px · 14 of 47 slices shown]
[im 1/47]
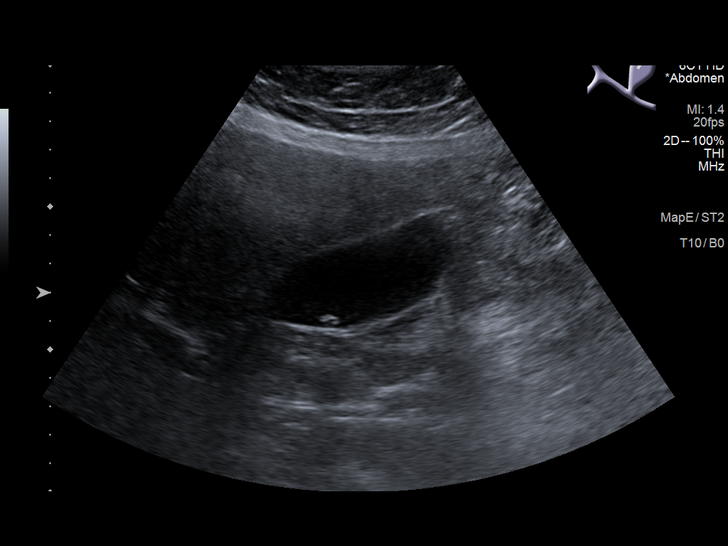
[im 4/47]
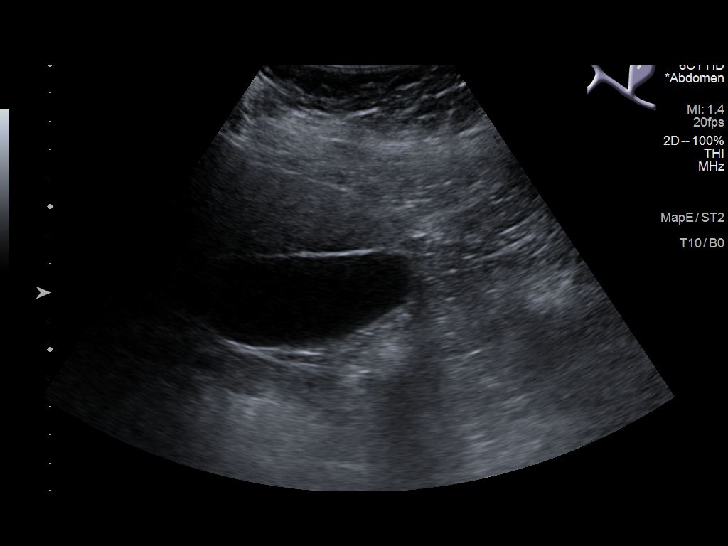
[im 8/47]
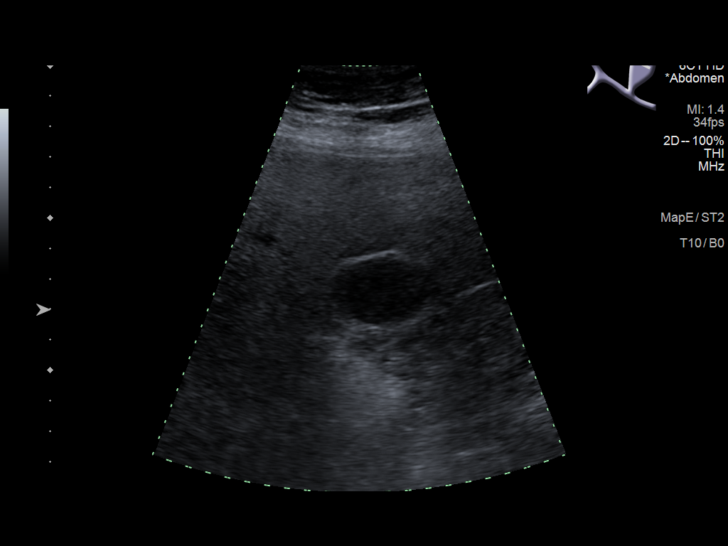
[im 12/47]
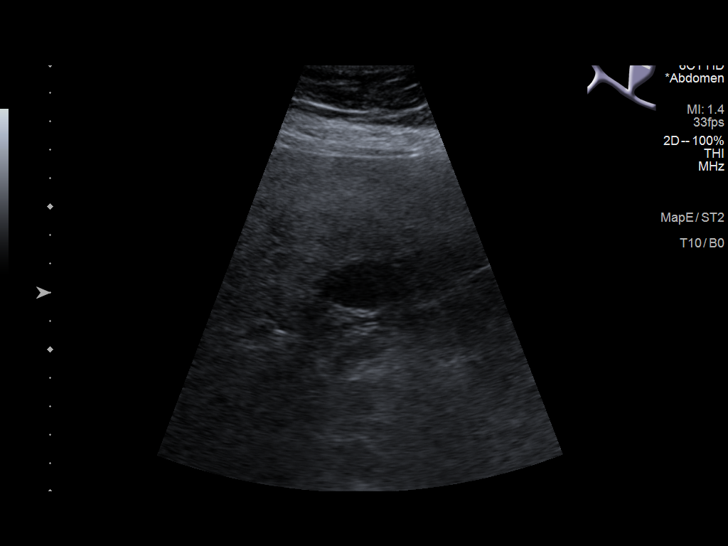
[im 16/47]
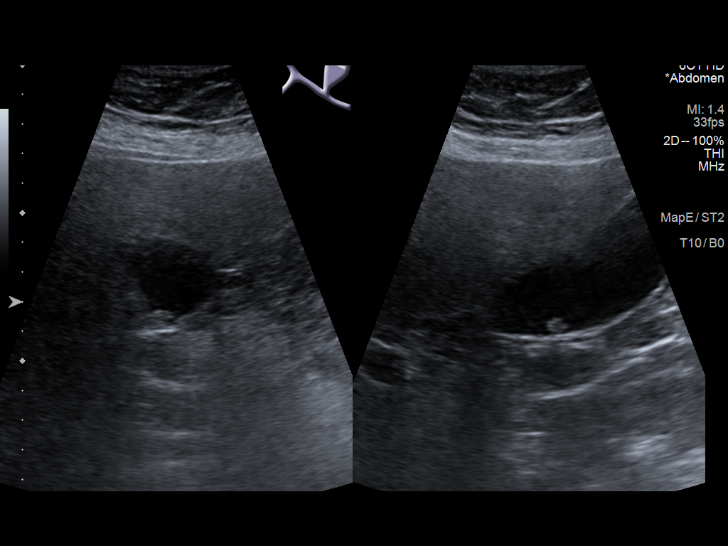
[im 18/47]
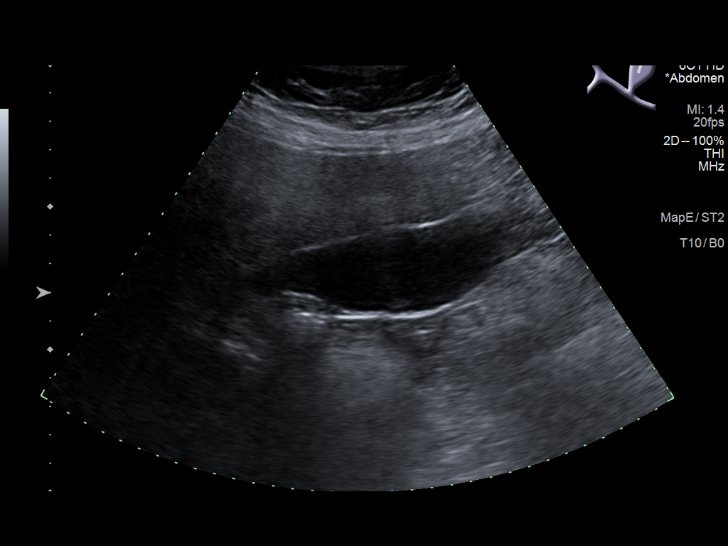
[im 22/47]
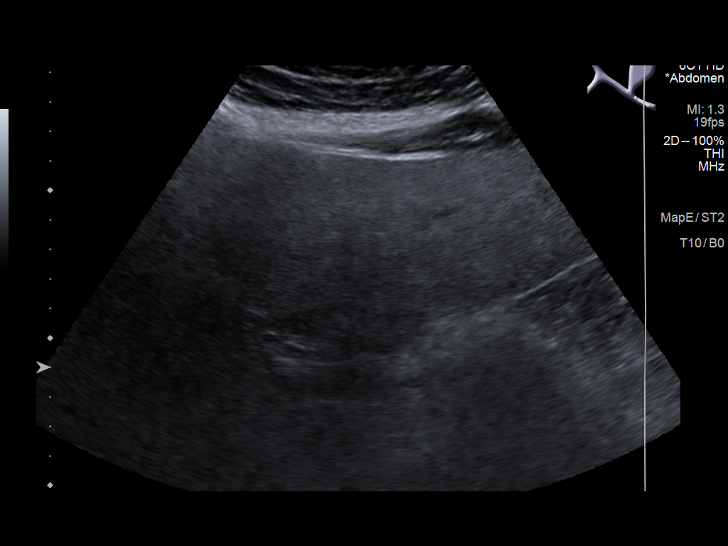
[im 25/47]
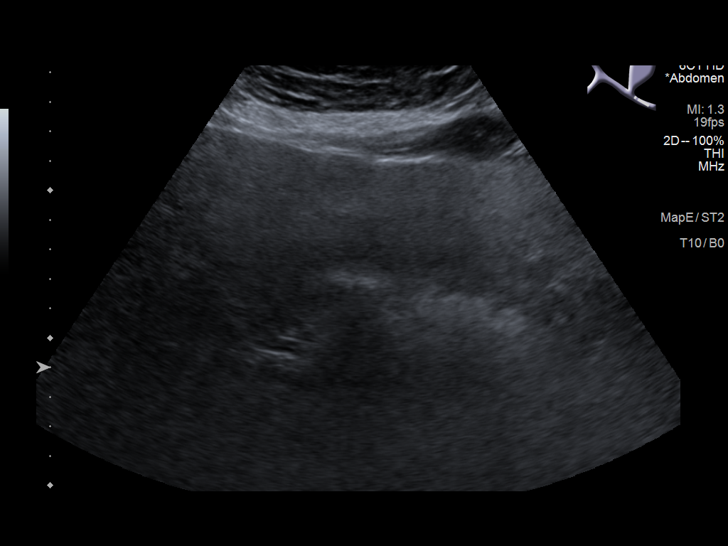
[im 29/47]
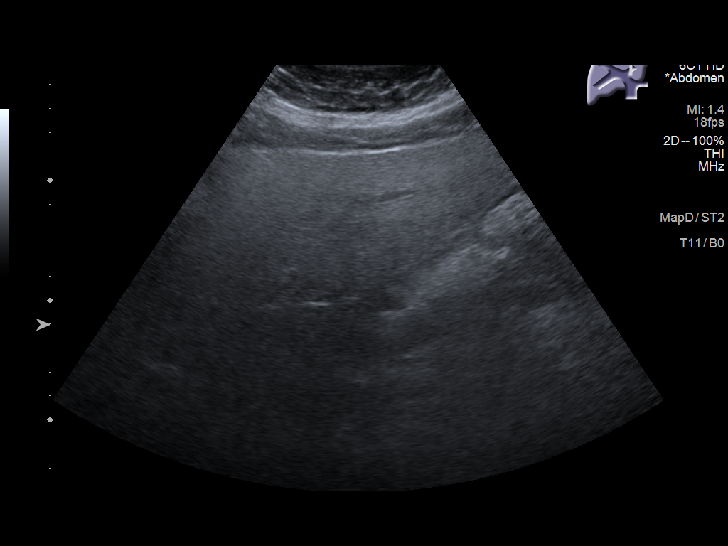
[im 31/47]
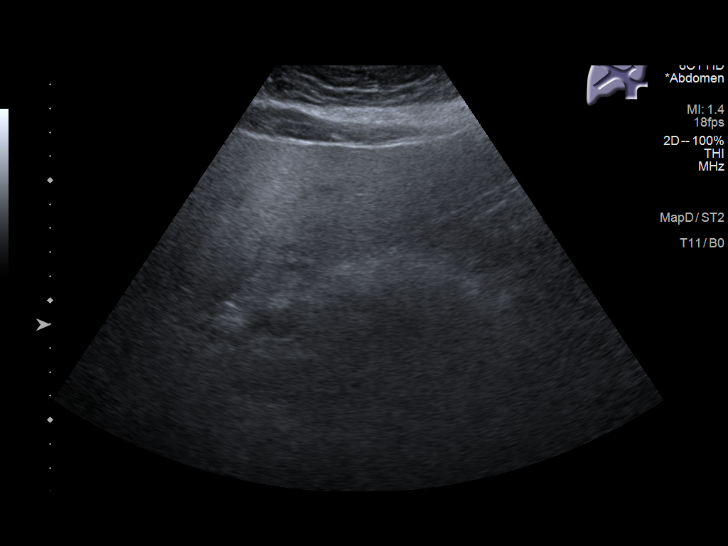
[im 35/47]
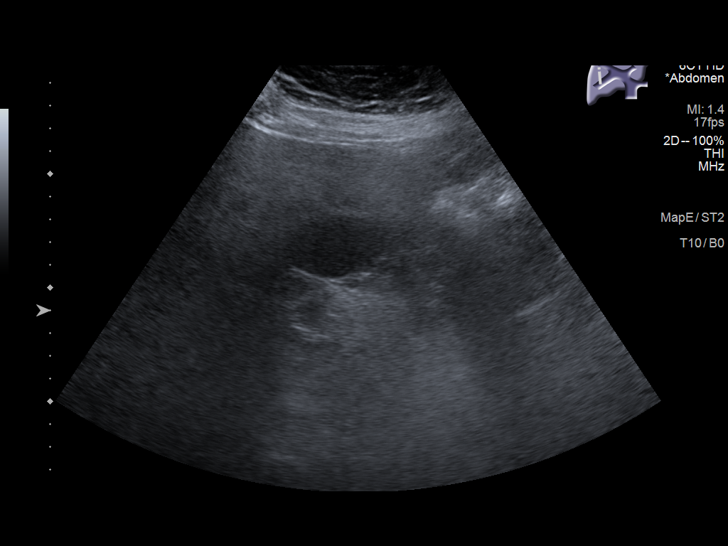
[im 39/47]
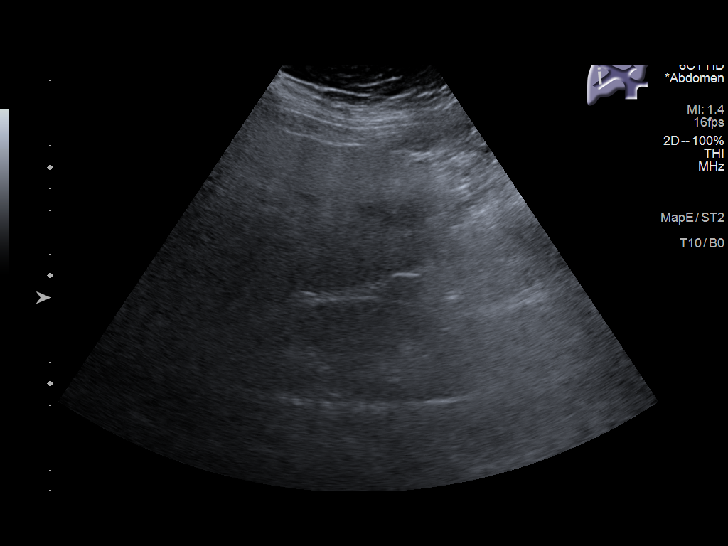
[im 43/47]
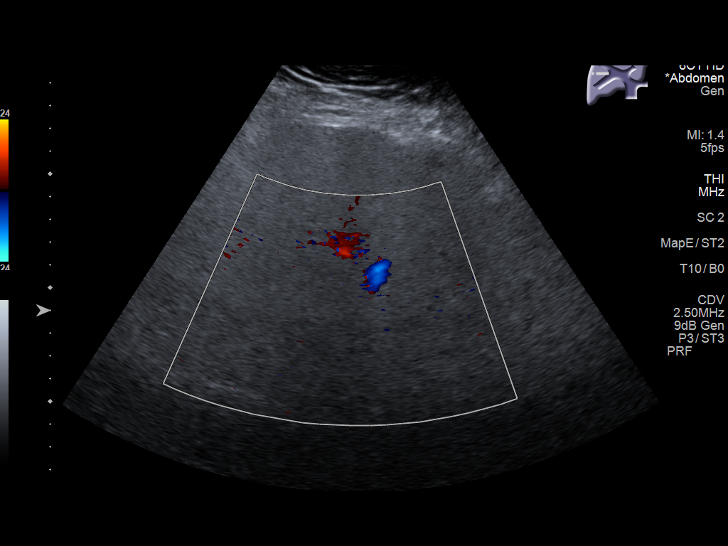
[im 47/47]
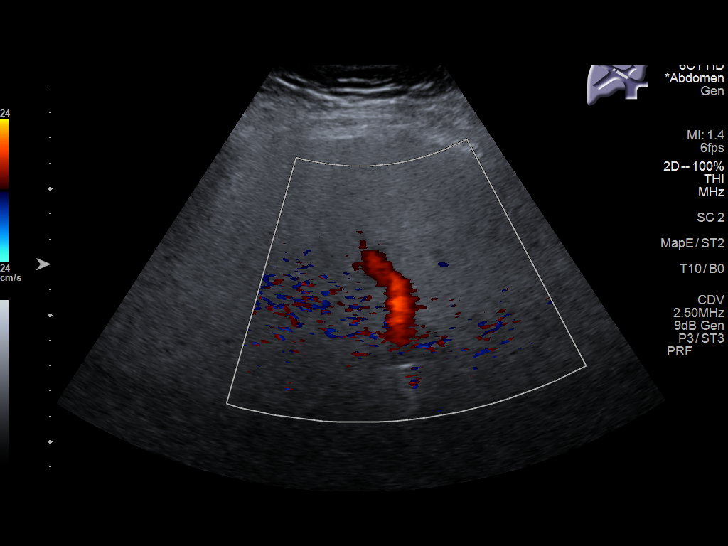

[14 of 25 positions shown; findings below may reference images not displayed]

FINDINGS: Gallbladder:

A 6 mm x 6 mm x 6 mm nonshadowing echogenic focus is seen along the
nondependent portion of the gallbladder wall. No gallstones or wall
thickening visualized (2.1 mm). No sonographic Murphy sign noted by
sonographer.

Common bile duct:

Diameter: 4.4 mm

Liver:

No focal lesion identified. Diffusely increased echogenicity of the
liver parenchyma is noted. Portal vein is patent on color Doppler
imaging with normal direction of blood flow towards the liver.

Other: The study is limited secondary to the patient's body habitus.
IMPRESSION: 1. 6 mm gallbladder polyp, likely benign. No additional follow-up or
imaging is recommended. This recommendation follows ACR consensus
guidelines: White Paper of the ACR Incidental Findings Committee II
on Gallbladder and Biliary Findings. [HOSPITAL]
2. Hepatic steatosis without focal liver lesions.

## 2022-03-02 ENCOUNTER — Encounter: Payer: Self-pay | Admitting: Physician Assistant

## 2022-03-02 ENCOUNTER — Ambulatory Visit: Payer: BC Managed Care – PPO | Admitting: Physician Assistant

## 2022-03-02 VITALS — BP 107/58 | HR 66 | Temp 98.3°F | Wt 241.0 lb

## 2022-03-02 DIAGNOSIS — L853 Xerosis cutis: Secondary | ICD-10-CM | POA: Insufficient documentation

## 2022-03-02 DIAGNOSIS — E785 Hyperlipidemia, unspecified: Secondary | ICD-10-CM | POA: Diagnosis not present

## 2022-03-02 DIAGNOSIS — I10 Essential (primary) hypertension: Secondary | ICD-10-CM

## 2022-03-02 DIAGNOSIS — D582 Other hemoglobinopathies: Secondary | ICD-10-CM | POA: Diagnosis not present

## 2022-03-02 DIAGNOSIS — B070 Plantar wart: Secondary | ICD-10-CM

## 2022-03-02 DIAGNOSIS — E1169 Type 2 diabetes mellitus with other specified complication: Secondary | ICD-10-CM

## 2022-03-02 DIAGNOSIS — R7989 Other specified abnormal findings of blood chemistry: Secondary | ICD-10-CM | POA: Diagnosis not present

## 2022-03-02 DIAGNOSIS — E119 Type 2 diabetes mellitus without complications: Secondary | ICD-10-CM | POA: Diagnosis not present

## 2022-03-02 LAB — POCT GLYCOSYLATED HEMOGLOBIN (HGB A1C)
Est. average glucose Bld gHb Est-mCnc: 151
Hemoglobin A1C: 6.9 % — AB (ref 4.0–5.6)

## 2022-03-02 NOTE — Progress Notes (Signed)
I,Roshena L Chambers,acting as a scribe for [employer], PA-C.,have documented all relevant documentation on the behalf of Mikey Kirschner, PA-C,as directed by  Mikey Kirschner, PA-C while in the presence of Mikey Kirschner, PA-C.   Established patient visit   [name]   DOB: 02-19-1966   56 y.o. Female  MRN: [id number] Visit Date: 03/02/2022  Today's healthcare provider: Mikey Kirschner, PA-C   Chief Complaint  Patient presents with   Diabetes   Subjective    HPI  Diabetes Mellitus Type II, Follow-up  Lab Results  Component Value Date   HGBA1C 6.9 (A) 03/02/2022   HGBA1C 11.5 (H) 10/25/2021   Wt Readings from Last 3 Encounters:  03/02/22 241 lb (109.3 kg)  12/28/21 235 lb 3.2 oz (106.7 kg)  11/30/21 223 lb (101.2 kg)   Last seen for diabetes 3 months ago.  Management since then includes discussing implications of disease, management, monitoring parameters with sugar. Explained MOA of disease. Reviewed a general diet. Discussed meeting w/ dietician/nutritionist Discussed checking fasting sugars daily, can start on ccm if insurance covers Starting with metformin titration to goal dose of 1000 mg bid.  She reports good compliance with treatment. She is not having side effects.  Symptoms: No fatigue No foot ulcerations  No appetite changes No nausea  No paresthesia of the feet  No polydipsia  No polyuria No visual disturbances   No vomiting     Home blood sugar records: fasting range: 125  Episodes of hypoglycemia? No    Current insulin regiment: none Most Recent Eye Exam: < 1 year ago  Current exercise: none Current diet habits: well balanced  Pertinent Labs: Lab Results  Component Value Date   CHOL 211 (H) 10/25/2021   HDL 43 10/25/2021   LDLCALC 135 (H) 10/25/2021   TRIG 183 (H) 10/25/2021   CHOLHDL 4.9 (H) 10/25/2021   Lab Results  Component Value Date   NA 138 10/25/2021   K 4.5 10/25/2021   CREATININE 0.60 10/25/2021   EGFR 105  10/25/2021   LABMICR <3.0 11/30/2021     ---------------------------------------------------------------------------------------------------   Medications: Outpatient Medications Prior to Visit  Medication Sig   atorvastatin (LIPITOR) 20 MG tablet Take 1 tablet (20 mg total) by mouth daily.   Cholecalciferol (VITAMIN D3) 125 MCG (5000 UT) CAPS Take 1 capsule by mouth daily.   citalopram (CELEXA) 40 MG tablet TAKE 1 TABLET BY MOUTH EVERY DAY (Patient taking differently: Take 10-20 mg by mouth daily.)   fluticasone (FLONASE) 50 MCG/ACT nasal spray Place 2 sprays into both nostrils daily. (Patient taking differently: Place 2 sprays into both nostrils daily as needed.)   metFORMIN (GLUCOPHAGE) 500 MG tablet Take 2 tablets (1,000 mg total) by mouth 2 (two) times daily. TAKE 2 TABLETS TWICE DAILY   Omega-3 Fatty Acids (FISH OIL) 1000 MG CAPS Take 1 capsule by mouth daily.   omeprazole (PRILOSEC) 20 MG capsule Take 20 mg by mouth daily.   traZODone (DESYREL) 150 MG tablet TAKE 1 TABLET (150 MG TOTAL) BY MOUTH AT BEDTIME. (Patient taking differently: Take 150 mg by mouth at bedtime as needed.)   cetirizine (ZYRTEC) 10 MG tablet Take 1 tablet (10 mg total) by mouth daily. (Patient taking differently: Take 10 mg by mouth daily as needed.)   No facility-administered medications prior to visit.    Review of Systems  Constitutional:  Negative for fatigue and fever.  Respiratory:  Negative for cough and shortness of breath.   Cardiovascular:  Negative for chest pain and leg swelling.  Gastrointestinal:  Negative for abdominal pain.  Neurological:  Negative for dizziness and headaches.       Objective    BP (!) 107/58 (BP Location: Left Arm, Patient Position: Sitting, Cuff Size: Large)   Pulse 66   Temp 98.3 F (36.8 C) (Oral)   Wt 241 lb (109.3 kg)   SpO2 99% Comment: room air  BMI 38.90 kg/m    Physical Exam Vitals reviewed.  Constitutional:      Appearance: She is not ill-appearing.   HENT:     Head: Normocephalic.  Eyes:     Conjunctiva/sclera: Conjunctivae normal.  Cardiovascular:     Rate and Rhythm: Normal rate and regular rhythm.     Pulses:          Dorsalis pedis pulses are 3+ on the right side and 3+ on the left side.       Posterior tibial pulses are 3+ on the right side and 3+ on the left side.     Heart sounds: No murmur heard. Pulmonary:     Effort: Pulmonary effort is normal. No respiratory distress.  Feet:     Right foot:     Protective Sensation: 4 sites tested.  4 sites sensed.     Skin integrity: Skin integrity normal.     Left foot:     Protective Sensation: 4 sites tested.  4 sites sensed.     Skin integrity: Skin integrity normal.     Comments: L foot sole w/ a  1-2 mm thickened area that is tender to touch Neurological:     General: No focal deficit present.     Mental Status: She is alert and oriented to person, place, and time.  Psychiatric:        Mood and Affect: Mood normal.        Behavior: Behavior normal.     Results for orders placed or performed in visit on 03/02/22  POCT HgB A1C  Result Value Ref Range   Hemoglobin A1C 6.9 (A) 4.0 - 5.6 %   Est. average glucose Bld gHb Est-mCnc 151     Assessment & Plan     Problem List Items Addressed This Visit       Cardiovascular and Mediastinum   Essential (primary) hypertension    Unmedicated, but well controlled with lifestyle changes,  pt reports average 120s/70s at home Will continue to monitor         Endocrine   Hyperlipidemia associated with type 2 diabetes mellitus ([place])    Repeat fasting lipids today On atorvastatin 20 mg tolerating well Goal LDL < 70 The 10-year ASCVD risk score (Arnett DK, et al., 2019) is: 9.6%       Relevant Orders   Lipid Profile   Diabetes mellitus without complication ([place]) - Primary    Today A1c is 6.9% congratulated on effort! Continue metformin 1000 mg BID, pt tolerating well Checking fasting sugars daily Foot exam completed  today uacr utd  Will discuss optho again next visit On statin, atorvastatin 20 mg checking fasting lipids today, LDL goal < 70 Pressure too low for ace-I/arb F/u 4 mo         Musculoskeletal and Integument   Dry skin    Unsure if secondary to new meds, advised increase in fluids Will check tsh/t4      Relevant Orders   TSH + free T4   Plantar wart of left foot  Vs retained foreign body Advised otc salicylic acid w/ occlusive dressing  Epsom salt foot soaks          Other   Elevated LFTs    Repeat cmp today to ensure resolution      Relevant Orders   Comprehensive Metabolic Panel (CMET)   Elevated hemoglobin (H[facility])    Repeat cbc today to ensure resolution. Pt is not a current smoker, but former, quit 2019      Relevant Orders   CBC w/Diff/Platelet     Return in about 4 months (around 07/03/2022) for DMII.      I, Mikey Kirschner, PA-C have reviewed all documentation for this visit. The documentation on  03/02/2022 for the exam, diagnosis, procedures, and orders are all accurate and complete.  Mikey Kirschner, PA-C [facility] [address] [place], [place], [address] Office: [phone] Fax: [place]

## 2022-03-02 NOTE — Assessment & Plan Note (Signed)
Vs retained foreign body Advised otc salicylic acid w/ occlusive dressing  Epsom salt foot soaks

## 2022-03-02 NOTE — Assessment & Plan Note (Addendum)
Today A1c is 6.9% congratulated on effort! Continue metformin 1000 mg BID, pt tolerating well Checking fasting sugars daily Foot exam completed today uacr utd  Will discuss optho again next visit On statin, atorvastatin 20 mg checking fasting lipids today, LDL goal < 70 Pressure too low for ace-I/arb F/u 4 mo

## 2022-03-02 NOTE — Assessment & Plan Note (Signed)
Unsure if secondary to new meds, advised increase in fluids Will check tsh/t4

## 2022-03-02 NOTE — Assessment & Plan Note (Addendum)
Repeat cbc today to ensure resolution. Pt is not a current smoker, but former, quit 2019

## 2022-03-02 NOTE — Assessment & Plan Note (Signed)
Repeat fasting lipids today On atorvastatin 20 mg tolerating well Goal LDL < 70 The 10-year ASCVD risk score (Arnett DK, et al., 2019) is: 9.6%

## 2022-03-02 NOTE — Assessment & Plan Note (Signed)
Repeat cmp today to ensure resolution

## 2022-03-02 NOTE — Assessment & Plan Note (Addendum)
Unmedicated, but well controlled with lifestyle changes,  pt reports average 120s/70s at home Will continue to monitor

## 2022-03-03 LAB — COMPREHENSIVE METABOLIC PANEL
ALT: 77 IU/L — ABNORMAL HIGH (ref 0–32)
AST: 58 IU/L — ABNORMAL HIGH (ref 0–40)
Albumin/Globulin Ratio: 1.8 (ref 1.2–2.2)
Albumin: 4.4 g/dL (ref 3.8–4.9)
Alkaline Phosphatase: 102 IU/L (ref 44–121)
BUN/Creatinine Ratio: 18 (ref 9–23)
BUN: 8 mg/dL (ref 6–24)
Bilirubin Total: 0.5 mg/dL (ref 0.0–1.2)
CO2: 21 mmol/L (ref 20–29)
Calcium: 9.6 mg/dL (ref 8.7–10.2)
Chloride: 104 mmol/L (ref 96–106)
Creatinine, Ser: 0.45 mg/dL — ABNORMAL LOW (ref 0.57–1.00)
Globulin, Total: 2.4 g/dL (ref 1.5–4.5)
Glucose: 102 mg/dL — ABNORMAL HIGH (ref 70–99)
Potassium: 4.3 mmol/L (ref 3.5–5.2)
Sodium: 140 mmol/L (ref 134–144)
Total Protein: 6.8 g/dL (ref 6.0–8.5)
eGFR: 112 mL/min/{1.73_m2} (ref >59)

## 2022-03-03 LAB — CBC WITH DIFFERENTIAL/PLATELET
Basophils Absolute: 0 10*3/uL (ref 0.0–0.2)
Basos: 0 %
EOS (ABSOLUTE): 0.1 10*3/uL (ref 0.0–0.4)
Eos: 2 %
Hematocrit: 42.8 % (ref 34.0–46.6)
Hemoglobin: 14.4 g/dL (ref 11.1–15.9)
Immature Grans (Abs): 0 10*3/uL (ref 0.0–0.1)
Immature Granulocytes: 0 %
Lymphocytes Absolute: 3.8 10*3/uL — ABNORMAL HIGH (ref 0.7–3.1)
Lymphs: 43 %
MCH: 30.2 pg (ref 26.6–33.0)
MCHC: 33.6 g/dL (ref 31.5–35.7)
MCV: 90 fL (ref 79–97)
Monocytes Absolute: 0.5 10*3/uL (ref 0.1–0.9)
Monocytes: 5 %
Neutrophils Absolute: 4.4 10*3/uL (ref 1.4–7.0)
Neutrophils: 50 %
Platelets: 337 10*3/uL (ref 150–450)
RBC: 4.77 x10E6/uL (ref 3.77–5.28)
RDW: 12.2 % (ref 11.7–15.4)
WBC: 8.8 10*3/uL (ref 3.4–10.8)

## 2022-03-03 LAB — TSH+FREE T4
Free T4: 1.16 ng/dL (ref 0.82–1.77)
TSH: 1.32 u[IU]/mL (ref 0.450–4.500)

## 2022-03-03 LAB — LIPID PANEL
Chol/HDL Ratio: 2.7 ratio (ref 0.0–4.4)
Cholesterol, Total: 137 mg/dL (ref 100–199)
HDL: 50 mg/dL (ref >39)
LDL Chol Calc (NIH): 69 mg/dL (ref 0–99)
Triglycerides: 96 mg/dL (ref 0–149)
VLDL Cholesterol Cal: 18 mg/dL (ref 5–40)

## 2022-03-10 ENCOUNTER — Other Ambulatory Visit: Payer: Self-pay | Admitting: Family Medicine

## 2022-03-10 ENCOUNTER — Other Ambulatory Visit: Payer: Self-pay | Admitting: Physician Assistant

## 2022-03-10 DIAGNOSIS — M25511 Pain in right shoulder: Secondary | ICD-10-CM

## 2022-03-10 DIAGNOSIS — N644 Mastodynia: Secondary | ICD-10-CM

## 2022-03-11 NOTE — Telephone Encounter (Signed)
Requested Prescriptions  Pending Prescriptions Disp Refills  . citalopram (CELEXA) 40 MG tablet [Pharmacy Med Name: CITALOPRAM HBR 40 MG TABLET] 90 tablet 0    Sig: TAKE 1 TABLET BY MOUTH EVERY DAY     Psychiatry:  Antidepressants - SSRI Passed - 03/10/2022  6:22 PM      Passed - Valid encounter within last 6 months    Recent Outpatient Visits          1 week ago Type 2 diabetes mellitus without complication, without long-term current use of insulin ([place])   [facility] Thedore Mins, [place], PA-C   3 months ago New onset type 2 diabetes mellitus [facility])   [facility] Mikey Kirschner, PA-C   4 months ago Encounter for annual physical exam   [facility], Dionne Bucy, MD   10 months ago Hypercholesterolemia without hypertriglyceridemia   [facility], Dionne Bucy, MD   12 months ago Breast pain, right   [facility] Mikey Kirschner, PA-C      Future Appointments            In 3 months Drubel, Ria Comment, PA-C [facility], [place]   In 7 months Bacigalupo, Dionne Bucy, MD [facility], [place]

## 2022-03-11 NOTE — Telephone Encounter (Signed)
Requested Prescriptions  Pending Prescriptions Disp Refills  . celecoxib (CELEBREX) 100 MG capsule [Pharmacy Med Name: CELECOXIB 100 MG CAPSULE] 60 capsule 0    Sig: TAKE 1 CAPSULE BY MOUTH TWICE A DAY     Analgesics:  COX2 Inhibitors Failed - 03/10/2022  6:45 PM      Failed - Manual Review: Labs are only required if the patient has taken medication for more than 8 weeks.      Failed - Cr in normal range and within 360 days    Creatinine, Ser  Date Value Ref Range Status  03/02/2022 0.45 (L) 0.57 - 1.00 mg/dL Final         Failed - AST in normal range and within 360 days    AST  Date Value Ref Range Status  03/02/2022 58 (H) 0 - 40 IU/L Final         Failed - ALT in normal range and within 360 days    ALT  Date Value Ref Range Status  03/02/2022 77 (H) 0 - 32 IU/L Final         Passed - HGB in normal range and within 360 days    Hemoglobin  Date Value Ref Range Status  03/02/2022 14.4 11.1 - 15.9 g/dL Final         Passed - HCT in normal range and within 360 days    Hematocrit  Date Value Ref Range Status  03/02/2022 42.8 34.0 - 46.6 % Final         Passed - eGFR is 30 or above and within 360 days    GFR calc Af Amer  Date Value Ref Range Status  09/09/2019 115 >59 mL/min/1.73 Final   GFR calc non Af Amer  Date Value Ref Range Status  09/09/2019 100 >59 mL/min/1.73 Final   eGFR  Date Value Ref Range Status  03/02/2022 112 >59 mL/min/1.73 Final         Passed - Patient is not pregnant      Passed - Valid encounter within last 12 months    Recent Outpatient Visits          1 week ago Type 2 diabetes mellitus without complication, without long-term current use of insulin ([place])   [facility] Thedore Mins, [place], PA-C   3 months ago New onset type 2 diabetes mellitus [facility])   [facility] Thedore Mins, [place], PA-C   4 months ago Encounter for annual physical exam   [facility] [facility], Dionne Bucy, MD   10 months ago  Hypercholesterolemia without hypertriglyceridemia   [facility] [address] [facility], Dionne Bucy, MD   12 months ago Breast pain, right   [facility] Mikey Kirschner, PA-C      Future Appointments            In 3 months Drubel, Ria Comment, PA-C [employer], [place]   In 7 months Bacigalupo, Dionne Bucy, MD [place], [place]

## 2022-05-02 ENCOUNTER — Ambulatory Visit: Payer: BC Managed Care – PPO | Admitting: Family Medicine

## 2022-06-10 ENCOUNTER — Ambulatory Visit
Admission: RE | Admit: 2022-06-10 | Discharge: 2022-06-10 | Disposition: A | Discharge status: Home / Self Care | Payer: BC Managed Care – PPO | Source: Ambulatory Visit | Attending: Physician Assistant | Admitting: Physician Assistant

## 2022-06-10 ENCOUNTER — Ambulatory Visit: Payer: BC Managed Care – PPO | Admitting: Physician Assistant

## 2022-06-10 ENCOUNTER — Encounter: Payer: Self-pay | Admitting: Physician Assistant

## 2022-06-10 ENCOUNTER — Ambulatory Visit
Admission: RE | Admit: 2022-06-10 | Discharge: 2022-06-10 | Disposition: A | Discharge status: Home / Self Care | Payer: BC Managed Care – PPO | Attending: Physician Assistant | Admitting: Physician Assistant

## 2022-06-10 VITALS — BP 119/60 | HR 86 | Temp 98.7°F | Ht 66.0 in | Wt 250.0 lb

## 2022-06-10 DIAGNOSIS — R1030 Lower abdominal pain, unspecified: Secondary | ICD-10-CM

## 2022-06-10 DIAGNOSIS — R109 Unspecified abdominal pain: Secondary | ICD-10-CM | POA: Diagnosis not present

## 2022-06-10 DIAGNOSIS — K5792 Diverticulitis of intestine, part unspecified, without perforation or abscess without bleeding: Secondary | ICD-10-CM

## 2022-06-10 LAB — POCT URINALYSIS DIPSTICK
Bilirubin, UA: NEGATIVE
Blood, UA: NEGATIVE
Glucose, UA: NEGATIVE
Ketones, UA: NEGATIVE
Leukocytes, UA: NEGATIVE
Nitrite, UA: NEGATIVE
Protein, UA: NEGATIVE
Spec Grav, UA: 1.02 (ref 1.010–1.025)
Urobilinogen, UA: NEGATIVE E.U./dL — AB
pH, UA: 7 (ref 5.0–8.0)

## 2022-06-10 MED ORDER — KETOROLAC TROMETHAMINE 10 MG PO TABS: 10.0000 mg | ORAL_TABLET | Freq: Four times a day (QID) | ORAL | 0 refills | Status: DC | PRN

## 2022-06-10 MED ORDER — METRONIDAZOLE 500 MG PO TABS: 500.0000 mg | ORAL_TABLET | Freq: Three times a day (TID) | ORAL | 0 refills | Status: AC

## 2022-06-10 MED ORDER — KETOROLAC TROMETHAMINE 60 MG/2ML IM SOLN
60.0000 mg | Freq: Once | INTRAMUSCULAR | Status: AC
  Administered 2022-06-10: 60 mg via INTRAMUSCULAR

## 2022-06-10 MED ORDER — CIPROFLOXACIN HCL 500 MG PO TABS: 500.0000 mg | ORAL_TABLET | Freq: Two times a day (BID) | ORAL | 0 refills | Status: AC

## 2022-06-10 NOTE — Progress Notes (Signed)
Established patient visit   [name]   DOB: 1965/09/30   58 y.o. Female  MRN: [id number] Visit Date: 06/10/2022  Today's healthcare provider: Mikey Kirschner, PA-C   Cc. Lower abdominal pain x 3 weeks with worsening  Subjective    Abdominal Pain   Pt reports lower abdominal pain x 3 weeks. She has a history of diverticulitis-- pt states pain started mid - lower left quadrant was a sharp pain that would resolve for a few hours. Felt like typical pain from diverticulitis. Pain worsened dramatically last night, associated with lower abdominal pressure, urgency. Pain is severe in nature and radiates to her suprapubic area. Pain makes moving difficult. Denies constipation or diarrhea, denies blood in stool, denies dysuria, hematuria.  Pt reports over the last two weeks feeling bloated and having some undigested food in her stool. Denies fever.     Medications: Outpatient Medications Prior to Visit  Medication Sig   atorvastatin (LIPITOR) 20 MG tablet Take 1 tablet (20 mg total) by mouth daily.   Cholecalciferol (VITAMIN D3) 125 MCG (5000 UT) CAPS Take 1 capsule by mouth daily.   citalopram (CELEXA) 40 MG tablet TAKE 1 TABLET BY MOUTH EVERY DAY   fluticasone (FLONASE) 50 MCG/ACT nasal spray Place 2 sprays into both nostrils daily. (Patient taking differently: Place 2 sprays into both nostrils daily as needed.)   metFORMIN (GLUCOPHAGE) 500 MG tablet Take 2 tablets (1,000 mg total) by mouth 2 (two) times daily. TAKE 2 TABLETS TWICE DAILY   Omega-3 Fatty Acids (FISH OIL) 1000 MG CAPS Take 1 capsule by mouth daily.   omeprazole (PRILOSEC) 20 MG capsule Take 20 mg by mouth daily.   traZODone (DESYREL) 150 MG tablet TAKE 1 TABLET (150 MG TOTAL) BY MOUTH AT BEDTIME. (Patient taking differently: Take 150 mg by mouth at bedtime as needed.)   [DISCONTINUED] cetirizine (ZYRTEC) 10 MG tablet Take 1 tablet (10 mg total) by mouth daily. (Patient taking differently: Take 10 mg by mouth  daily as needed.)   No facility-administered medications prior to visit.    Review of Systems  Gastrointestinal:  Positive for abdominal pain.     Objective    BP 119/60 (BP Location: Right Arm, Patient Position: Sitting, Cuff Size: Large)   Pulse 86   Temp 98.7 F (37.1 C)   Ht '5\' 6"'$  (1.676 m)   Wt 250 lb (113.4 kg)   SpO2 97%   BMI 40.35 kg/m    Physical Exam Constitutional:      General: She is awake.     Appearance: She is well-developed.  HENT:     Head: Normocephalic.  Eyes:     Conjunctiva/sclera: Conjunctivae normal.  Cardiovascular:     Rate and Rhythm: Normal rate and regular rhythm.     Heart sounds: Normal heart sounds.  Pulmonary:     Effort: Pulmonary effort is normal.     Breath sounds: Normal breath sounds.  Abdominal:     General: There is distension.     Tenderness: There is abdominal tenderness in the suprapubic area, left upper quadrant and left lower quadrant. There is no guarding or rebound.     Hernia: No hernia is present.  Skin:    General: Skin is warm.  Neurological:     Mental Status: She is alert and oriented to person, place, and time.  Psychiatric:        Attention and Perception: Attention normal.  Mood and Affect: Mood normal.        Speech: Speech normal.        Behavior: Behavior is cooperative.     Results for orders placed or performed in visit on 06/10/22  POCT Urinalysis Dipstick  Result Value Ref Range   Color, UA YELLOW    A-1 Antitrypsin Clear CLEAR    Glucose, UA Negative Negative   Bilirubin, UA NEG    Ketones, UA NEG    Spec Grav, UA 1.020 1.010 - 1.025   Blood, UA NEG    pH, UA 7.0 5.0 - 8.0   Protein, UA Negative Negative   Urobilinogen, UA negative (A) 0.2 or 1.0 E.U./dL   Nitrite, UA NEG    Leukocytes, UA Negative Negative   Appearance     Odor      Assessment & Plan     Lower abdominal pain, diverticulitis Sus for divertic vs nephrolithiasis  UA today neg leuk, blood.  Stat KUB ordered  r/o stone  Advised typically I would order labs before treating but as it is Friday-- empirically treating for divertic, metronidazole and cipro x 7 days Advised liquid/bland diet, increase fluids. Toradol injection given in office, 60 mg. Toradol PO 10 mg given, take at least 8 hours after injection   Strict ED precautions given  Return if symptoms worsen or fail to improve.      I, Mikey Kirschner, PA-C have reviewed all documentation for this visit. The documentation on  06/10/22 for the exam, diagnosis, procedures, and orders are all accurate and complete.  Mikey Kirschner, PA-C [facility] [address] [place], [place], [address] Office: [phone] Fax: [place]

## 2022-06-29 ENCOUNTER — Ambulatory Visit: Payer: BC Managed Care – PPO | Admitting: Orthopaedic Surgery

## 2022-06-29 ENCOUNTER — Encounter: Payer: Self-pay | Admitting: Orthopaedic Surgery

## 2022-06-29 DIAGNOSIS — M7061 Trochanteric bursitis, right hip: Secondary | ICD-10-CM | POA: Diagnosis not present

## 2022-06-29 MED ORDER — METHYLPREDNISOLONE ACETATE 40 MG/ML IJ SUSP
40.0000 mg | INTRAMUSCULAR | Status: AC | PRN
  Administered 2022-06-29: 40 mg via INTRA_ARTICULAR

## 2022-06-29 MED ORDER — LIDOCAINE HCL 1 % IJ SOLN
3.0000 mL | INTRAMUSCULAR | Status: AC | PRN
  Administered 2022-06-29: 3 mL

## 2022-06-29 NOTE — Progress Notes (Signed)
HPI: Heather Orr returns today for right hip pain.  She status post right total hip arthroplasty 09/22/2017.  She states she is just having pain in the proximal inguinal fold area and lateral aspect of the hip when walking at a fast pace.  Or on uneven trails.  She has had no injury.  She is doing no other exercises.  She is diabetic reports that her last hemoglobin A1c recently was 6.9.  She had no fevers or chills.  Reports no abduction exercises to right hip. Radiographs AP abdomen does show the proximal right total hip arthroplasty components.  Cannot see the distal portion of the stem though.  Films from January 19 showed the hip to be well located.  The acetabular component appears well-seated.  There is no obvious loosening of the proximal femoral component.  Review of systems: See HPI otherwise negative  Physical exam: General well-developed well-nourished female who ambulates with a nonantalgic gait without any assistive device. Psych: Alert and oriented x 3  Bilateral hips: Good range of motion both hips.  Internal/external rotation left hip causes no pain.  Internal rotation right hip causes pain lateral aspect of the hip.  Tenderness over the right hip trochanteric region and down the IT band.  Plan: Given findings of trochanteric bursitis recommend steroid injection she is agreeable.  She will monitor her glucose levels closely over the next 24 to 48 hours.  She is shown IT band stretching exercises.  If her pain persist or becomes worse we will have her return.  At that time would repeat low AP pelvis and a lateral view of the right hip.  Questions were encouraged and answered  Procedure Note  [name]             Date of Birth: 1966-01-06           MRN: [id number]             Visit Date: 06/29/2022  Procedures: Visit Diagnoses:  1. Trochanteric bursitis, right hip     Large Joint Inj on 06/29/2022 9:01 AM Indications: pain Details: 22 G 1.5 in needle, lateral  approach  Arthrogram: No  Medications: 3 mL lidocaine 1 %; 40 mg methylPREDNISolone acetate 40 MG/ML Outcome: tolerated well, no immediate complications Procedure, treatment alternatives, risks and benefits explained, specific risks discussed. Consent was given by the patient. Immediately prior to procedure a time out was called to verify the correct patient, procedure, equipment, support staff and site/side marked as required. Patient was prepped and draped in the usual sterile fashion.

## 2022-07-04 ENCOUNTER — Encounter: Payer: Self-pay | Admitting: Physician Assistant

## 2022-07-04 ENCOUNTER — Ambulatory Visit: Payer: BC Managed Care – PPO | Admitting: Physician Assistant

## 2022-07-04 VITALS — BP 118/54 | HR 88 | Temp 97.6°F | Ht 66.0 in | Wt 250.0 lb

## 2022-07-04 DIAGNOSIS — E1169 Type 2 diabetes mellitus with other specified complication: Secondary | ICD-10-CM | POA: Diagnosis not present

## 2022-07-04 DIAGNOSIS — R7989 Other specified abnormal findings of blood chemistry: Secondary | ICD-10-CM

## 2022-07-04 DIAGNOSIS — E119 Type 2 diabetes mellitus without complications: Secondary | ICD-10-CM

## 2022-07-04 DIAGNOSIS — E785 Hyperlipidemia, unspecified: Secondary | ICD-10-CM

## 2022-07-04 DIAGNOSIS — R197 Diarrhea, unspecified: Secondary | ICD-10-CM

## 2022-07-04 LAB — POCT GLYCOSYLATED HEMOGLOBIN (HGB A1C): Hemoglobin A1C: 6.8 % — AB (ref 4.0–5.6)

## 2022-07-04 MED ORDER — ATORVASTATIN CALCIUM 20 MG PO TABS: 20.0000 mg | ORAL_TABLET | Freq: Every day | ORAL | 3 refills | Status: DC

## 2022-07-04 MED ORDER — TIRZEPATIDE 2.5 MG/0.5ML ~~LOC~~ SOAJ: 2.5000 mg | SUBCUTANEOUS | 0 refills | Status: DC

## 2022-07-04 NOTE — Assessment & Plan Note (Addendum)
Advised dark/black stools a sign of GI bleed Stay consistent with PPI If they recur advised GI referral Diarrhea could be 2/2 diverticulitis or metformin  If persistent would consider XR metformin instead of BID Repeat cbc today look for anemia

## 2022-07-04 NOTE — Assessment & Plan Note (Addendum)
Today's A1c is 6.8%, stable from 6.9% last visit Continue metformin 1000 mg BID I think she is an excellent candidate for Mounjaro or Ozempic --  --explained MOA, SE, method of use. Rx mounjaro 2.5 mg weekly x 4 weeks and then would increase to 5 mg and stay there for ~3 months. Advised if her insurance declines mounjaro I would rx ozempic.   Foot exam, uacr, utd. On statin Bp too low for ace/arb and uacr normal F/u 4 mo

## 2022-07-04 NOTE — Assessment & Plan Note (Addendum)
Repeat cmp , h/o fatty liver on ultrasound Will continue to monitor

## 2022-07-04 NOTE — Progress Notes (Signed)
Established patient visit   [name]   DOB: 08/27/1965   57 y.o. Female  MRN: [id number] Visit Date: 07/04/2022  Today's healthcare provider: Mikey Kirschner, PA-C   Cc. DM II f/u   Subjective    HPI  Pt reports residual diarrhea and abdominal pain occasionally since her diverticulitis flare. Reports she was having black stool but this has resolved. She is taking her omeprazole daily.   Pt has questions about starting Ozempic or Mounjaro for glycemic control and help with weight loss.   Diabetes Mellitus Type II, Follow-up  Lab Results  Component Value Date   HGBA1C 6.8 (A) 07/04/2022   HGBA1C 6.9 (A) 03/02/2022   HGBA1C 11.5 (H) 10/25/2021   Wt Readings from Last 3 Encounters:  07/04/22 250 lb (113.4 kg)  06/10/22 250 lb (113.4 kg)  03/02/22 241 lb (109.3 kg)   Last seen for diabetes 4 months ago. Pt is managed on metformin 1000 mg BID.   She is having side effects. Diarrhea. Manageable.   Current insulin regiment: none  Pertinent Labs: Lab Results  Component Value Date   CHOL 137 03/02/2022   HDL 50 03/02/2022   LDLCALC 69 03/02/2022   TRIG 96 03/02/2022   CHOLHDL 2.7 03/02/2022   Lab Results  Component Value Date   NA 140 03/02/2022   K 4.3 03/02/2022   CREATININE 0.45 (L) 03/02/2022   EGFR 112 03/02/2022   LABMICR <3.0 11/30/2021   MICRALBCREAT <8 11/30/2021     ---------------------------------------------------------------------------------------------------   Medications: Outpatient Medications Prior to Visit  Medication Sig   Cholecalciferol (VITAMIN D3) 125 MCG (5000 UT) CAPS Take 1 capsule by mouth daily.   citalopram (CELEXA) 40 MG tablet TAKE 1 TABLET BY MOUTH EVERY DAY   fluticasone (FLONASE) 50 MCG/ACT nasal spray Place 2 sprays into both nostrils daily. (Patient taking differently: Place 2 sprays into both nostrils daily as needed.)   metFORMIN (GLUCOPHAGE) 500 MG tablet Take 2 tablets (1,000 mg total) by mouth 2 (two)  times daily. TAKE 2 TABLETS TWICE DAILY   Omega-3 Fatty Acids (FISH OIL) 1000 MG CAPS Take 1 capsule by mouth daily.   omeprazole (PRILOSEC) 20 MG capsule Take 20 mg by mouth daily.   traZODone (DESYREL) 150 MG tablet TAKE 1 TABLET (150 MG TOTAL) BY MOUTH AT BEDTIME. (Patient taking differently: Take 150 mg by mouth at bedtime as needed.)   [DISCONTINUED] atorvastatin (LIPITOR) 20 MG tablet Take 1 tablet (20 mg total) by mouth daily.   No facility-administered medications prior to visit.    Review of Systems  Constitutional:  Negative for fatigue and fever.  Respiratory:  Negative for cough and shortness of breath.   Cardiovascular:  Negative for chest pain and leg swelling.  Gastrointestinal:  Negative for abdominal pain.  Neurological:  Negative for dizziness and headaches.      Objective    BP (!) 118/54 (BP Location: Left Arm, Patient Position: Sitting, Cuff Size: Large)   Pulse 88   Temp 97.6 F (36.4 C)   Ht 5' 6"$  (1.676 m)   Wt 250 lb (113.4 kg)   SpO2 97%   BMI 40.35 kg/m   Physical Exam Vitals reviewed.  Constitutional:      Appearance: She is not ill-appearing.  HENT:     Head: Normocephalic.  Eyes:     Conjunctiva/sclera: Conjunctivae normal.  Cardiovascular:     Rate and Rhythm: Normal rate.  Pulmonary:     Effort: Pulmonary effort  is normal. No respiratory distress.  Neurological:     General: No focal deficit present.     Mental Status: She is alert and oriented to person, place, and time.  Psychiatric:        Mood and Affect: Mood normal.        Behavior: Behavior normal.      Results for orders placed or performed in visit on 07/04/22  POCT glycosylated hemoglobin (Hb A1C)  Result Value Ref Range   Hemoglobin A1C 6.8 (A) 4.0 - 5.6 %   HbA1c POC (<> result, manual entry)     HbA1c, POC (prediabetic range)     HbA1c, POC (controlled diabetic range)      Assessment & Plan     Problem List Items Addressed This Visit       Endocrine    Hyperlipidemia associated with type 2 diabetes mellitus ([place])    Pulled for refill of statin      Relevant Medications   atorvastatin (LIPITOR) 20 MG tablet   tirzepatide (MOUNJARO) 2.5 MG/0.5ML Pen   Diabetes mellitus without complication (HCC) - Primary    Today's A1c is 6.8%, stable from 6.9% last visit Continue metformin 1000 mg BID I think she is an excellent candidate for Mounjaro or Ozempic --  --explained MOA, SE, method of use. Rx mounjaro 2.5 mg weekly x 4 weeks and then would increase to 5 mg and stay there for ~3 months. Advised if her insurance declines mounjaro I would rx ozempic.   Foot exam, uacr, utd. On statin Bp too low for ace/arb and uacr normal F/u 4 mo      Relevant Medications   atorvastatin (LIPITOR) 20 MG tablet   tirzepatide (MOUNJARO) 2.5 MG/0.5ML Pen   Other Relevant Orders   POCT glycosylated hemoglobin (Hb A1C) (Completed)     Other   Elevated LFTs    Repeat cmp , h/o fatty liver on ultrasound Will continue to monitor      Relevant Orders   Comprehensive Metabolic Panel (CMET)   Diarrhea    Advised dark/black stools a sign of GI bleed Stay consistent with PPI If they recur advised GI referral Diarrhea could be 2/2 diverticulitis or metformin  If persistent would consider XR metformin instead of BID Repeat cbc today look for anemia      Relevant Orders   CBC w/Diff/Platelet      Return in about 4 months (around 11/02/2022) for DMII, weight Management.      I, Mikey Kirschner, PA-C have reviewed all documentation for this visit. The documentation on  07/04/22  for the exam, diagnosis, procedures, and orders are all accurate and complete.  Mikey Kirschner, PA-C [facility] [address] [place], [place], [address] Office: [phone] Fax: [place]

## 2022-07-04 NOTE — Assessment & Plan Note (Signed)
Pulled for refill of statin

## 2022-07-05 LAB — COMPREHENSIVE METABOLIC PANEL
ALT: 94 IU/L — ABNORMAL HIGH (ref 0–32)
AST: 78 IU/L — ABNORMAL HIGH (ref 0–40)
Albumin/Globulin Ratio: 2 (ref 1.2–2.2)
Albumin: 4.6 g/dL (ref 3.8–4.9)
Alkaline Phosphatase: 100 IU/L (ref 44–121)
BUN/Creatinine Ratio: 22 (ref 9–23)
BUN: 12 mg/dL (ref 6–24)
Bilirubin Total: 0.6 mg/dL (ref 0.0–1.2)
CO2: 22 mmol/L (ref 20–29)
Calcium: 9.8 mg/dL (ref 8.7–10.2)
Chloride: 102 mmol/L (ref 96–106)
Creatinine, Ser: 0.54 mg/dL — ABNORMAL LOW (ref 0.57–1.00)
Globulin, Total: 2.3 g/dL (ref 1.5–4.5)
Glucose: 122 mg/dL — ABNORMAL HIGH (ref 70–99)
Potassium: 4.7 mmol/L (ref 3.5–5.2)
Sodium: 142 mmol/L (ref 134–144)
Total Protein: 6.9 g/dL (ref 6.0–8.5)
eGFR: 107 mL/min/{1.73_m2} (ref >59)

## 2022-07-05 LAB — CBC WITH DIFFERENTIAL/PLATELET
Basophils Absolute: 0 10*3/uL (ref 0.0–0.2)
Basos: 0 %
EOS (ABSOLUTE): 0.1 10*3/uL (ref 0.0–0.4)
Eos: 1 %
Hematocrit: 45.1 % (ref 34.0–46.6)
Hemoglobin: 14.7 g/dL (ref 11.1–15.9)
Immature Grans (Abs): 0 10*3/uL (ref 0.0–0.1)
Immature Granulocytes: 0 %
Lymphocytes Absolute: 3.9 10*3/uL — ABNORMAL HIGH (ref 0.7–3.1)
Lymphs: 41 %
MCH: 29.5 pg (ref 26.6–33.0)
MCHC: 32.6 g/dL (ref 31.5–35.7)
MCV: 91 fL (ref 79–97)
Monocytes Absolute: 0.5 10*3/uL (ref 0.1–0.9)
Monocytes: 5 %
Neutrophils Absolute: 5 10*3/uL (ref 1.4–7.0)
Neutrophils: 53 %
Platelets: 396 10*3/uL (ref 150–450)
RBC: 4.98 x10E6/uL (ref 3.77–5.28)
RDW: 12.5 % (ref 11.7–15.4)
WBC: 9.6 10*3/uL (ref 3.4–10.8)

## 2022-07-30 ENCOUNTER — Other Ambulatory Visit: Payer: Self-pay | Admitting: Physician Assistant

## 2022-07-30 DIAGNOSIS — E119 Type 2 diabetes mellitus without complications: Secondary | ICD-10-CM

## 2022-08-01 NOTE — Telephone Encounter (Signed)
Requested medication (s) are due for refill today: yes  Requested medication (s) are on the active medication list: yes  Last refill:  07/04/22 #53m 0 refills  Future visit scheduled: yes in 3 months   Notes to clinic:  medication not assigned to a protocol do you want to refill Rx?     Requested Prescriptions  Pending Prescriptions Disp Refills   MOUNJARO 2.5 MG/0.5ML Pen [Pharmacy Med Name: MOUNJARO 2.5 MG/0.5 ML PEN]      Sig: INJECT 2.5 MG INTO THE SKIN ONCE A WEEK. FOR FOUR WEEKS     Off-Protocol Failed - 07/30/2022 10:17 PM      Failed - Medication not assigned to a protocol, review manually.      Passed - Valid encounter within last 12 months    Recent Outpatient Visits           4 weeks ago Diabetes mellitus without complication ([facility]   [place] Kir[place]-C   1 month ago Diverticulitis   [place]   5 months ago Type 2 diabetes mellitus without complication, without long-term current use of insulin ([facility]   [place] M[place] PA-C   8 months ago New onset type 2 diabetes mellitus ([facility]   [place] Kir[place] PA-C   9 months ago Encounter for annual physical exam   [place] Bucy MD       Future Appointments             In 3 months Bacigalupo, ADionne Bucy MD [facility]

## 2022-08-03 NOTE — Telephone Encounter (Addendum)
Pt is calling in to follow up on her medication refill for tirzepatide [facility]) 2.5 MG/0.5ML Pen. Stated per pharmacy insurance would not let them fill needs to go up a dose. Mentioned she was advised by pharmacy that if PCP keeps her on this dose, PA may be needed, pharmacy does not have a 2.5 MG dose at this time, it is backordered.    Please advise.

## 2022-08-04 ENCOUNTER — Telehealth: Payer: Self-pay

## 2022-08-04 DIAGNOSIS — E119 Type 2 diabetes mellitus without complications: Secondary | ICD-10-CM

## 2022-08-04 NOTE — Telephone Encounter (Signed)
Copied from [place] [id number]. Topic: General - Other >> Aug 04, 2022  9:22 AM Everette C wrote: Reason for CRM: The patient has called to follow up on their previous reuqest for contact with their PCP regarding their tirzepatide [facility] 2.5 MG/0.5ML Pen [id number] prescription   The patient has been told by their insurance that their medication increase would be covered but that the patient will not be able to stay at 2.5 MG   The patient would like to discuss this further when available

## 2022-08-05 MED ORDER — TIRZEPATIDE 5 MG/0.5ML ~~LOC~~ SOAJ: 5.0000 mg | SUBCUTANEOUS | 1 refills | Status: DC

## 2022-08-05 NOTE — Addendum Note (Signed)
Addended by: Shawna Orleans on: 08/05/2022 01:12 PM   Modules accepted: Orders

## 2022-08-05 NOTE — Telephone Encounter (Signed)
Increase her to 5mg  weekly dose. Ok to send new Rx

## 2022-08-05 NOTE — Telephone Encounter (Signed)
Patient advised that new increased prescription will be sent. Advised to [facility] with [facility] for availability or for PA. And to let us know.

## 2022-10-05 ENCOUNTER — Other Ambulatory Visit: Payer: Self-pay | Admitting: Family Medicine

## 2022-10-05 DIAGNOSIS — E119 Type 2 diabetes mellitus without complications: Secondary | ICD-10-CM

## 2022-10-05 MED ORDER — METFORMIN HCL 500 MG PO TABS: 1000.0000 mg | ORAL_TABLET | Freq: Two times a day (BID) | ORAL | 0 refills | Status: DC

## 2022-10-05 NOTE — Telephone Encounter (Signed)
Requested Prescriptions  Pending Prescriptions Disp Refills   metFORMIN (GLUCOPHAGE) 500 MG tablet 360 tablet 0    Sig: Take 2 tablets (1,000 mg total) by mouth 2 (two) times daily. TAKE 2 TABLETS TWICE DAILY     Endocrinology:  Diabetes - Biguanides Failed - 10/05/2022 10:02 AM      Failed - Cr in normal range and within 360 days    Creatinine, Ser  Date Value Ref Range Status  07/04/2022 0.54 (L) 0.57 - 1.00 mg/dL Final         Failed - B12 Level in normal range and within 720 days    No results found for: "VITAMINB12"       Passed - HBA1C is between 0 and 7.9 and within 180 days    Hemoglobin A1C  Date Value Ref Range Status  07/04/2022 6.8 (A) 4.0 - 5.6 % Final   Hgb A1c MFr Bld  Date Value Ref Range Status  10/25/2021 11.5 (H) 4.8 - 5.6 % Final    Comment:             Prediabetes: 5.7 - 6.4          Diabetes: >6.4          Glycemic control for adults with diabetes: <7.0          Passed - eGFR in normal range and within 360 days    GFR calc Af Amer  Date Value Ref Range Status  09/09/2019 115 >59 mL/min/1.73 Final   GFR calc non Af Amer  Date Value Ref Range Status  09/09/2019 100 >59 mL/min/1.73 Final   eGFR  Date Value Ref Range Status  07/04/2022 107 >59 mL/min/1.73 Final         Passed - Valid encounter within last 6 months    Recent Outpatient Visits           3 months ago Diabetes mellitus without complication [facility])   [place] [facility] Alfredia Ferguson, PA-C   3 months ago Diverticulitis   [facility] Alfredia Ferguson, PA-C   7 months ago Type 2 diabetes mellitus without complication, without long-term current use of insulin [facility])   [place] [facility] [place], [place], PA-C   10 months ago New onset type 2 diabetes mellitus [facility])   [place] [facility] Alfredia Ferguson, PA-C   11 months ago Encounter for annual physical exam    [facility]  [place], Marzella Schlein, MD       Future Appointments             In 1 month Bacigalupo, Marzella Schlein, MD [facility]            Passed - CBC within normal limits and completed in the last 12 months    WBC  Date Value Ref Range Status  07/04/2022 9.6 3.4 - 10.8 x10E3/uL Final  09/23/2017 14.1 (H) 4.0 - 10.5 K/uL Final   RBC  Date Value Ref Range Status  07/04/2022 4.98 3.77 - 5.28 x10E6/uL Final  09/23/2017 4.18 3.87 - 5.11 MIL/uL Final   Hemoglobin  Date Value Ref Range Status  07/04/2022 14.7 11.1 - 15.9 g/dL Final   Hematocrit  Date Value Ref Range Status  07/04/2022 45.1 34.0 - 46.6 % Final   MCHC  Date Value Ref Range Status  07/04/2022 32.6 31.5 - 35.7 g/dL Final  13/12/6576 46.9 30.0 - 36.0 g/dL Final  [facility]  Date Value Ref Range Status  07/04/2022 29.5 26.6 - 33.0 pg Final  09/23/2017 30.1 26.0 - 34.0 pg Final   MCV  Date Value Ref Range Status  07/04/2022 91 79 - 97 fL Final   No results found for: "PLTCOUNTKUC", "LABPLAT", "POCPLA" RDW  Date Value Ref Range Status  07/04/2022 12.5 11.7 - 15.4 % Final

## 2022-10-05 NOTE — Telephone Encounter (Signed)
Medication Refill - Medication: metFORMIN (GLUCOPHAGE) 500 MG tablet   Has the patient contacted their pharmacy? Yes.   (Agent: If no, request that the patient contact the pharmacy for the refill. If patient does not wish to contact the pharmacy document the reason why and proceed with request.) (Agent: If yes, when and what did the pharmacy advise?)  Preferred Pharmacy (with phone number or street name):  [facility] [address] [facility] - [place], [place] - [address]  [address] [place] [address]  Phone: [phone] Fax: [phone]   Has the patient been seen for an appointment in the last year OR does the patient have an upcoming appointment? Yes.    Agent: Please be advised that RX refills may take up to 3 business days. We ask that you follow-up with your pharmacy.

## 2022-10-25 ENCOUNTER — Telehealth: Payer: Self-pay

## 2022-10-25 MED ORDER — TIRZEPATIDE 7.5 MG/0.5ML ~~LOC~~ SOAJ: 7.5000 mg | SUBCUTANEOUS | 0 refills | Status: DC

## 2022-10-25 NOTE — Telephone Encounter (Signed)
Call attempted. NA vm is full. [facility]

## 2022-10-25 NOTE — Telephone Encounter (Signed)
Ok to go ahead and sendin 7.5 mg Mounjaro dose x1 month. Will assess at visit on 6/25

## 2022-10-25 NOTE — Telephone Encounter (Signed)
Copied from CRM [id number]. Topic: General - Inquiry >> Oct 25, 2022  9:00 AM Pincus Sanes wrote: Reason for CRM: Pt was sch for CPE 6/10 and that appt was going to keep her aligned correctly as will be out of her  Disp Refills Start End  tirzepatide [facility]) 5 MG/0.5ML Pen On the 14th. CPE is now 6/25 and pt will not be seen nor have med after 6/14th. Pt knows being proactive on this as does not want to run out yet feels that there was going to be an increase in the med so wanted to address before it happened, pls fu at [phone]

## 2022-10-25 NOTE — Addendum Note (Signed)
Addended by: Hyacinth Meeker on: 10/25/2022 11:17 AM   Modules accepted: Orders

## 2022-10-31 ENCOUNTER — Encounter: Payer: BC Managed Care – PPO | Admitting: Family Medicine

## 2022-11-15 ENCOUNTER — Encounter: Payer: Self-pay | Admitting: Family Medicine

## 2022-11-15 ENCOUNTER — Other Ambulatory Visit (HOSPITAL_COMMUNITY)
Admission: RE | Admit: 2022-11-15 | Discharge: 2022-11-15 | Disposition: A | Discharge status: Home / Self Care | Payer: BC Managed Care – PPO | Source: Ambulatory Visit | Attending: Family Medicine | Admitting: Family Medicine

## 2022-11-15 ENCOUNTER — Ambulatory Visit (INDEPENDENT_AMBULATORY_CARE_PROVIDER_SITE_OTHER): Payer: BC Managed Care – PPO | Admitting: Family Medicine

## 2022-11-15 VITALS — BP 97/61 | HR 76 | Temp 98.0°F | Ht 66.0 in | Wt 231.9 lb

## 2022-11-15 DIAGNOSIS — Z124 Encounter for screening for malignant neoplasm of cervix: Secondary | ICD-10-CM | POA: Diagnosis not present

## 2022-11-15 DIAGNOSIS — E1169 Type 2 diabetes mellitus with other specified complication: Secondary | ICD-10-CM

## 2022-11-15 DIAGNOSIS — E1159 Type 2 diabetes mellitus with other circulatory complications: Secondary | ICD-10-CM

## 2022-11-15 DIAGNOSIS — I152 Hypertension secondary to endocrine disorders: Secondary | ICD-10-CM

## 2022-11-15 DIAGNOSIS — E785 Hyperlipidemia, unspecified: Secondary | ICD-10-CM | POA: Diagnosis not present

## 2022-11-15 DIAGNOSIS — Z0000 Encounter for general adult medical examination without abnormal findings: Secondary | ICD-10-CM

## 2022-11-15 DIAGNOSIS — Z1231 Encounter for screening mammogram for malignant neoplasm of breast: Secondary | ICD-10-CM

## 2022-11-15 DIAGNOSIS — Z87891 Personal history of nicotine dependence: Secondary | ICD-10-CM

## 2022-11-15 NOTE — Assessment & Plan Note (Signed)
Well controlled Continue current medications Recheck metabolic panel F/u in 6 months  

## 2022-11-15 NOTE — Assessment & Plan Note (Signed)
Previously well controlled Continue statin Repeat FLP and CMP Goal LDL < 70 
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
## 2022-11-15 NOTE — Assessment & Plan Note (Signed)
BMI 37 and associated with HTN, HLD Discussed importance of healthy weight management Discussed diet and exercise

## 2022-11-15 NOTE — Assessment & Plan Note (Signed)
Stable on Metformin and Mounjaro 7.5mg  with noted weight loss. Reports occasional nausea, especially when not eating. Discussed importance of protein intake and clarified misconceptions about meal timing. Last A1c was 6.8. -Continue Metformin and Mounjaro 7.5mg . -Check A1c, cholesterol, kidney and liver function today. -Consider discontinuing Metformin if diarrhea becomes a quality of life issue. Encourage eye exam UACR today

## 2022-11-15 NOTE — Assessment & Plan Note (Signed)
Lung cancer screening

## 2022-11-16 ENCOUNTER — Other Ambulatory Visit: Payer: Self-pay | Admitting: *Deleted

## 2022-11-16 DIAGNOSIS — Z122 Encounter for screening for malignant neoplasm of respiratory organs: Secondary | ICD-10-CM

## 2022-11-16 DIAGNOSIS — Z87891 Personal history of nicotine dependence: Secondary | ICD-10-CM

## 2022-11-16 LAB — COMPREHENSIVE METABOLIC PANEL
ALT: 43 IU/L — ABNORMAL HIGH (ref 0–32)
AST: 31 IU/L (ref 0–40)
Albumin: 4.4 g/dL (ref 3.8–4.9)
Alkaline Phosphatase: 94 IU/L (ref 44–121)
BUN/Creatinine Ratio: 14 (ref 9–23)
BUN: 9 mg/dL (ref 6–24)
Bilirubin Total: 0.6 mg/dL (ref 0.0–1.2)
CO2: 22 mmol/L (ref 20–29)
Calcium: 9.8 mg/dL (ref 8.7–10.2)
Chloride: 106 mmol/L (ref 96–106)
Creatinine, Ser: 0.66 mg/dL (ref 0.57–1.00)
Globulin, Total: 2.3 g/dL (ref 1.5–4.5)
Glucose: 96 mg/dL (ref 70–99)
Potassium: 4.2 mmol/L (ref 3.5–5.2)
Sodium: 141 mmol/L (ref 134–144)
Total Protein: 6.7 g/dL (ref 6.0–8.5)
eGFR: 102 mL/min/{1.73_m2} (ref >59)

## 2022-11-16 LAB — HEMOGLOBIN A1C
Est. average glucose Bld gHb Est-mCnc: 100 mg/dL
Hgb A1c MFr Bld: 5.1 % (ref 4.8–5.6)

## 2022-11-16 LAB — MICROALBUMIN / CREATININE URINE RATIO
Creatinine, Urine: 215.4 mg/dL
Microalb/Creat Ratio: 5 mg/g creat (ref 0–29)
Microalbumin, Urine: 9.8 ug/mL

## 2022-11-16 LAB — LIPID PANEL
Chol/HDL Ratio: 2.6 ratio (ref 0.0–4.4)
Cholesterol, Total: 118 mg/dL (ref 100–199)
HDL: 46 mg/dL (ref >39)
LDL Chol Calc (NIH): 50 mg/dL (ref 0–99)
Triglycerides: 126 mg/dL (ref 0–149)
VLDL Cholesterol Cal: 22 mg/dL (ref 5–40)

## 2022-11-18 LAB — CYTOLOGY - PAP
Comment: NEGATIVE
Diagnosis: NEGATIVE
High risk HPV: NEGATIVE

## 2022-11-25 ENCOUNTER — Other Ambulatory Visit: Payer: Self-pay | Admitting: Family Medicine

## 2022-11-25 NOTE — Telephone Encounter (Signed)
Requested medication (s) are due for refill today: yes  Requested medication (s) are on the active medication list: yes  Last refill:  10/25/22 2 ml  Future visit scheduled: yes  Notes to clinic:  med not assigned to a protocol   Requested Prescriptions  Pending Prescriptions Disp Refills   tirzepatide (MOUNJARO) 7.5 MG/0.5ML Pen 2 mL 0    Sig: Inject 7.5 mg into the skin once a week.     Off-Protocol Failed - 11/25/2022  1:16 PM      Failed - Medication not assigned to a protocol, review manually.      Passed - Valid encounter within last 12 months    Recent Outpatient Visits           1 week ago Encounter for annual physical exam   [facility] [place], Marzella Schlein, MD   4 months ago Diabetes mellitus without complication [facility])   [place] [facility] Alfredia Ferguson, PA-C   5 months ago Diverticulitis   [facility] Alfredia Ferguson, PA-C   8 months ago Type 2 diabetes mellitus without complication, without long-term current use of insulin [facility])   [place] [facility] [place], [place], PA-C   12 months ago New onset type 2 diabetes mellitus [facility])    [facility] Alfredia Ferguson, PA-C       Future Appointments             In 2 months Gollan, Tollie Pizza, MD [facility] [place]   In 5 months Bacigalupo, Marzella Schlein, MD [facility]

## 2022-11-25 NOTE — Telephone Encounter (Signed)
Medication Refill - Medication: tirzepatide (MOUNJARO) 7.5 MG/0.5ML Pen   Has the patient contacted their pharmacy? No.   Preferred Pharmacy (with phone number or street name):  [facility] [address] [facility] - [place], [place] - [address] Phone: [phone]  Fax: [phone]      Has the patient been seen for an appointment in the last year OR does the patient have an upcoming appointment? Yes.    Please assist patient further

## 2022-11-28 MED ORDER — TIRZEPATIDE 7.5 MG/0.5ML ~~LOC~~ SOAJ: 7.5000 mg | SUBCUTANEOUS | 0 refills | Status: DC

## 2022-12-12 DIAGNOSIS — Z1231 Encounter for screening mammogram for malignant neoplasm of breast: Secondary | ICD-10-CM | POA: Diagnosis not present

## 2022-12-12 LAB — HM MAMMOGRAPHY

## 2022-12-15 DIAGNOSIS — E119 Type 2 diabetes mellitus without complications: Secondary | ICD-10-CM | POA: Diagnosis not present

## 2022-12-15 LAB — HM DIABETES EYE EXAM

## 2022-12-23 ENCOUNTER — Encounter: Payer: Self-pay | Admitting: Physician Assistant

## 2022-12-23 ENCOUNTER — Other Ambulatory Visit: Payer: Self-pay | Admitting: Family Medicine

## 2022-12-23 ENCOUNTER — Ambulatory Visit (INDEPENDENT_AMBULATORY_CARE_PROVIDER_SITE_OTHER): Payer: BC Managed Care – PPO | Admitting: Physician Assistant

## 2022-12-23 DIAGNOSIS — Z87891 Personal history of nicotine dependence: Secondary | ICD-10-CM

## 2022-12-23 NOTE — Progress Notes (Signed)
Virtual Visit via Telephone Note  I connected with Nailyn Dearinger on 12/23/22 at 9:42 AM by telephone and verified that I am speaking with the correct person using two identifiers.  Location: Patient: home Provider: working virtually from home   I discussed the limitations, risks, security and privacy concerns of performing an evaluation and management service by telephone and the availability of in person appointments. I also discussed with the patient that there may be a patient responsible charge related to this service. The patient expressed understanding and agreed to proceed.       Shared Decision Making Visit Lung Cancer Screening Program [phone])   Eligibility: Age 57 Pack Years Smoking History Calculation 26 (# packs/per year x # years smoked) Recent History of coughing up blood  no Unexplained weight loss? No ( >Than 15 pounds within the last 6 months ) Prior History Lung / other cancer No (Diagnosis within the last 5 years already requiring surveillance chest CT Scans). Smoking Status Former Smoker Former Smokers: Years since quit: 5 years  Quit Date: 2019  Visit Components: Discussion included one or more decision making aids? Yes Discussion included risk/benefits of screening. Yes Discussion included potential follow up diagnostic testing for abnormal scans. Yes Discussion included meaning and risk of over diagnosis. Yes Discussion included meaning and risk of False Positives. Yes Discussion included meaning of total radiation exposure. Yes  Counseling Included: Importance of adherence to annual lung cancer LDCT screening. Yes Impact of comorbidities on ability to participate in the program. Yes Ability and willingness to under diagnostic treatment. Yes  Smoking Cessation Counseling: Former Smokers:  Discussed the importance of maintaining cigarette abstinence. Yes Diagnosis Code: Personal History of Nicotine Dependence. G40.102 Information about tobacco  cessation classes and interventions provided to patient. Yes Written Order for Lung Cancer Screening with LDCT placed in Epic. Yes (CT Chest Lung Cancer Screening Low Dose W/O CM) [id number] Z12.2-Screening of respiratory organs Z87.891-Personal history of nicotine dependence    I spent 25 minutes of face to face time/virtual visit time  with the patient discussing the risks and benefits of lung cancer screening. We took the time to pause at intervals to allow for questions to be asked and answered to ensure understanding. We discussed that they had taken the single most powerful action possible to decrease their risk of developing lung cancer when they quit smoking. I counseled them to remain smoke free, and to contact the office if they ever had the desire to smoke again so that we can provide resources and tools to help support the effort to remain smoke free. We discussed the time and location of the scan, and they  will receive a call or letter with the results within  24-72 hours of receiving them. They have the office contact information in the event they have questions.   They verbalized understanding of all of the above and had no further questions.    I explained to the patient that there has been a high incidence of coronary artery disease noted on these exams. I explained that this is a non-gated exam therefore degree or severity cannot be determined. This patient is on statin therapy. I have asked the patient to follow-up with their PCP regarding any incidental finding of coronary artery disease and management with diet or medication as they feel is clinically indicated. The patient verbalized understanding of the above and had no further questions.      Darcella Gasman , PA-C

## 2022-12-23 NOTE — Patient Instructions (Signed)

## 2022-12-23 NOTE — Telephone Encounter (Signed)
Requested Prescriptions  Pending Prescriptions Disp Refills   MOUNJARO 7.5 MG/0.5ML Pen [Pharmacy Med Name: MOUNJARO 7.5 MG/0.5 ML PEN[$R]] 2 mL 0    Sig: INJECT THE CONTENTS OF ONE PEN UNDER THE SKIN WEEKLY ON THE SAME DAY EACH WEEK     Off-Protocol Failed - 12/23/2022 12:53 PM      Failed - Medication not assigned to a protocol, review manually.      Passed - Valid encounter within last 12 months    Recent Outpatient Visits           1 month ago Encounter for annual physical exam   [place] [facility] [place], Marzella Schlein, MD   5 months ago Diabetes mellitus without complication [facility])   [place] [facility] Alfredia Ferguson, PA-C   6 months ago Diverticulitis   [facility] Alfredia Ferguson, PA-C   9 months ago Type 2 diabetes mellitus without complication, without long-term current use of insulin [facility])   [place] [facility] Alfredia Ferguson, PA-C   1 year ago New onset type 2 diabetes mellitus [facility])   [place] [facility] Alfredia Ferguson, PA-C       Future Appointments             In 1 month Gollan, Tollie Pizza, MD [facility] [place]   In 5 months Bacigalupo, Marzella Schlein, MD [facility]

## 2022-12-28 ENCOUNTER — Ambulatory Visit
Admission: RE | Admit: 2022-12-28 | Discharge: 2022-12-28 | Disposition: A | Discharge status: Home / Self Care | Payer: BC Managed Care – PPO | Source: Ambulatory Visit | Attending: Acute Care | Admitting: Acute Care

## 2022-12-28 DIAGNOSIS — Z122 Encounter for screening for malignant neoplasm of respiratory organs: Secondary | ICD-10-CM | POA: Insufficient documentation

## 2022-12-28 DIAGNOSIS — Z87891 Personal history of nicotine dependence: Secondary | ICD-10-CM | POA: Insufficient documentation

## 2023-01-02 ENCOUNTER — Telehealth: Payer: Self-pay | Admitting: Acute Care

## 2023-01-02 NOTE — Telephone Encounter (Signed)
[facility] calling with call report for Kandice Robinsons, NP

## 2023-01-02 NOTE — Telephone Encounter (Signed)
Received call report and noted in LCS dashboard.  Will route to provider for review IMPRESSION: 1. Lung-RADS 4A, suspicious. Follow up low-dose chest CT without contrast in 3 months (please use the following order, "CT CHEST LCS NODULE FOLLOW-UP W/O CM") is recommended. Alternatively, PET may be considered when there is a solid component 8mm or larger. 2. Coronary artery calcifications. 3. Aortic Atherosclerosis (ICD10-I70.0) and Emphysema (ICD10-J43.9).

## 2023-01-05 ENCOUNTER — Telehealth: Payer: Self-pay | Admitting: Acute Care

## 2023-01-05 NOTE — Telephone Encounter (Signed)
I have attempted to call the patient with the results of their  Low Dose CT Chest Lung cancer screening scan. There was no answer. I have left a HIPPA compliant VM requesting the patient call the office for the scan results. I included the office contact information in the message. We will await their return call. If no return call we will continue to call until patient is contacted.   Pt. Needs a 3 month follow up scan , due after 03/30/2023. Thanks so much

## 2023-01-06 ENCOUNTER — Telehealth: Payer: Self-pay | Admitting: Acute Care

## 2023-01-06 DIAGNOSIS — R911 Solitary pulmonary nodule: Secondary | ICD-10-CM

## 2023-01-06 DIAGNOSIS — Z87891 Personal history of nicotine dependence: Secondary | ICD-10-CM

## 2023-01-06 NOTE — Telephone Encounter (Signed)
See telephone note dated 01/06/2023

## 2023-01-06 NOTE — Telephone Encounter (Signed)
I have called the patient with the results of her screening CT Chest. Her scan was read as a LR 4A. There is a 9 mm LUL nodule that we will re-evaluate in November 2024.  We also discussed the finding of Coronary artery calcifications and Aortic Atherosclerosis in addition to emphysema.  She is on statin therapy . She has family history of heart disease. She is followed closely by her PCP.  Dr. Beryle Flock, we will order follow up scan in November 2024 to re-evaluate the 9 mm nodule. We wil make sure you are aware of those results also. Thanks so much  Sherre Lain, And Robynn Pane, please order 3 month follow up due 03/2023, and fax results to PCP. Marland Kitchen Thanks so much

## 2023-01-08 NOTE — Telephone Encounter (Signed)
CT results faxed to PCP. Order placed for 3 mth nodule f/u CT.  °

## 2023-01-08 NOTE — Telephone Encounter (Signed)
See telephone note 01/06/23

## 2023-01-13 ENCOUNTER — Ambulatory Visit: Payer: BC Managed Care – PPO | Admitting: Cardiovascular Disease

## 2023-01-13 ENCOUNTER — Encounter: Payer: Self-pay | Admitting: Family Medicine

## 2023-01-13 NOTE — Telephone Encounter (Signed)
Ok to send in [employer] 7.5 mg weekly 1 month supply with 2 refills.

## 2023-01-16 MED ORDER — TIRZEPATIDE 10 MG/0.5ML ~~LOC~~ SOAJ: 10.0000 mg | SUBCUTANEOUS | 1 refills | Status: DC

## 2023-01-24 ENCOUNTER — Other Ambulatory Visit: Payer: Self-pay | Admitting: Family Medicine

## 2023-01-24 DIAGNOSIS — E119 Type 2 diabetes mellitus without complications: Secondary | ICD-10-CM

## 2023-01-26 ENCOUNTER — Ambulatory Visit: Payer: BC Managed Care – PPO | Admitting: Family Medicine

## 2023-01-26 ENCOUNTER — Ambulatory Visit: Payer: Self-pay | Admitting: *Deleted

## 2023-01-26 VITALS — BP 108/65 | HR 75 | Temp 97.8°F | Resp 12 | Ht 66.0 in | Wt 232.1 lb

## 2023-01-26 DIAGNOSIS — R1030 Lower abdominal pain, unspecified: Secondary | ICD-10-CM | POA: Diagnosis not present

## 2023-01-26 LAB — POCT URINALYSIS DIPSTICK
Bilirubin, UA: NEGATIVE
Blood, UA: NEGATIVE
Glucose, UA: NEGATIVE
Ketones, UA: NEGATIVE
Leukocytes, UA: NEGATIVE
Nitrite, UA: NEGATIVE
Protein, UA: NEGATIVE
Spec Grav, UA: 1.025 (ref 1.010–1.025)
Urobilinogen, UA: 0.2 U/dL
pH, UA: 6 (ref 5.0–8.0)

## 2023-01-26 NOTE — Telephone Encounter (Signed)
Reason for Disposition  [1] MODERATE pain (e.g., interferes with normal activities) AND [2] pain comes and goes (cramps) AND [3] present > 24 hours  (Exception: Pain with Vomiting or Diarrhea - see that Guideline.)  Answer Assessment - Initial Assessment Questions 1. LOCATION: "Where does it hurt?"      I'm having lower abd pain. 2. RADIATION: "Does the pain shoot anywhere else?" (e.g., chest, back)     It's around belly button and to the left and lower.   3. ONSET: "When did the pain begin?" (e.g., minutes, hours or days ago)      Over 2 weeks now almost 3 weeks now.    I had abd issues several months ago.   It was an infection in my colon.  When my bladder is full it feels worse.    I've kidney stones before in the 1980's.    No burning with urination.  I'm having normal BMs.   Last night it flared up. 4. SUDDEN: "Gradual or sudden onset?"     Not asked 5. PATTERN "Does the pain come and go, or is it constant?"    - If it comes and goes: "How long does it last?" "Do you have pain now?"     (Note: Comes and goes means the pain is intermittent. It goes away completely between bouts.)    - If constant: "Is it getting better, staying the same, or getting worse?"      (Note: Constant means the pain never goes away completely; most serious pain is constant and gets worse.)      Flared up last night 6. SEVERITY: "How bad is the pain?"  (e.g., Scale 1-10; mild, moderate, or severe)    - MILD (1-3): Doesn't interfere with normal activities, abdomen soft and not tender to touch.     - MODERATE (4-7): Interferes with normal activities or awakens from sleep, abdomen tender to touch.     - SEVERE (8-10): Excruciating pain, doubled over, unable to do any normal activities.       Intermittently 7. RECURRENT SYMPTOM: "Have you ever had this type of stomach pain before?" If Yes, ask: "When was the last time?" and "What happened that time?"      No 8. CAUSE: "What do you think is causing the stomach  pain?"     I don't know 9. RELIEVING/AGGRAVATING FACTORS: "What makes it better or worse?" (e.g., antacids, bending or twisting motion, bowel movement)     Not asked 10. OTHER SYMPTOMS: "Do you have any other symptoms?" (e.g., back pain, diarrhea, fever, urination pain, vomiting)       Bladder feels full.      No diarrhea or vomiting 11. PREGNANCY: "Is there any chance you are pregnant?" "When was your last menstrual period?"       Not asked  Protocols used: Abdominal Pain - [facility]

## 2023-01-26 NOTE — Progress Notes (Signed)
Acute Office Visit  Subjective:     Patient ID: Heather Orr, female    DOB: 1965/09/10, 57 y.o.   MRN: [id number]  Chief Complaint  Patient presents with   Abdominal Pain    X 3 weeks. She denies any nausea, vomiting, blood in urine or painful urination.     HPI Discussed the use of AI scribe software for clinical note transcription with the patient, who gave verbal consent to proceed.  History of Present Illness   The patient presents with a three-week history of lower abdominal pain, predominantly midline, over the bladder. The pain is not relieved by urination, although urination temporarily eases the discomfort. The patient initially attributed the pain to gas, but despite passing gas and having bowel movements, the pain persists. The patient denies constipation and reports having a normal bowel movement the previous day. The pain had subsided last week but returned abruptly the previous night, described as a stabbing sensation. The patient has a history of kidney stones and diverticulitis, but the current pain is in a different location and of a different nature. The patient also reports a sensation akin to a muscle spasm higher up in the abdomen, but this is not as severe as the lower abdominal pain. The patient denies any fever and has not noticed any vaginal bleeding. The patient has a history of ovarian cysts but these were small and asymptomatic.        ROS per HPI      Objective:    BP 108/65 (BP Location: Left Arm, Patient Position: Sitting, Cuff Size: Large)   Pulse 75   Temp 97.8 F (36.6 C) (Temporal)   Resp 12   Ht 5\' 6"  (1.676 m)   Wt 232 lb 1.6 oz (105.3 kg)   BMI 37.46 kg/m    Physical Exam  Physical Exam   ABDOMEN: Tenderness localized to the lower abdomen, specifically suprapubically. No tenderness elsewhere. No guarding or rebound. Marked tenderness over the lower abdomen, particularly severe on palpation over the suprapubic area.       Results  for orders placed or performed in visit on 01/26/23  POCT urinalysis dipstick  Result Value Ref Range   Color, UA yellow    Clarity, UA clear    Glucose, UA Negative Negative   Bilirubin, UA Negative    Ketones, UA Negative    Spec Grav, UA 1.025 1.010 - 1.025   Blood, UA Negative    pH, UA 6.0 5.0 - 8.0   Protein, UA Negative Negative   Urobilinogen, UA 0.2 0.2 or 1.0 E.U./dL   Nitrite, UA Negative    Leukocytes, UA Negative Negative   Appearance     Odor          Assessment & Plan:   Problem List Items Addressed This Visit   None Visit Diagnoses     Lower abdominal pain    -  Primary   Relevant Orders   POCT urinalysis dipstick (Completed)   US Pelvic Complete With Transvaginal           Lower abdominal pain Pain localized to the lower abdomen, predominantly on suprapubic, for approximately three weeks. No changes in bowel movements or urinary symptoms. Pain is sharp and can be exacerbated by movement. History of diverticulitis and ovarian cysts, but current symptoms are different. No fever or vaginal bleeding. UA normal with no signs of UTI. -Order pelvic ultrasound to evaluate uterus, ovaries, and cervix. -If new symptoms develop, such  as fever or changes in bowel movements, patient to notify the office. - do not think this is a mounjaro side effect        No orders of the defined types were placed in this encounter.   No follow-ups on file.  Shirlee Latch, MD

## 2023-01-26 NOTE — Telephone Encounter (Signed)
  Chief Complaint: Lower abd pain below belly button and to the left. Symptoms: above going on almost 3 weeks now. Frequency: For the almost last 3 weeks Pertinent Negatives: Patient denies diarrhea or vomiting or nausea.   Just a discomfort over her bladder area and to the left Disposition: [] ED /[] Urgent Care (no appt availability in office) / [x] Appointment(In office/virtual)/ []  [place] [facility]/ [] Home Care/ [] Refused Recommended Disposition /[] [place] [facility]/ []  Follow-up with PCP Additional Notes: Appt made for today with Dr. Beryle Flock for 3:20.
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
## 2023-01-30 ENCOUNTER — Telehealth: Payer: Self-pay | Admitting: Family Medicine

## 2023-01-30 ENCOUNTER — Ambulatory Visit: Payer: BC Managed Care – PPO | Attending: Cardiovascular Disease | Admitting: Cardiovascular Disease

## 2023-01-30 ENCOUNTER — Encounter: Payer: Self-pay | Admitting: Cardiovascular Disease

## 2023-01-30 VITALS — BP 118/68 | HR 83 | Ht 66.0 in | Wt 227.0 lb

## 2023-01-30 DIAGNOSIS — E1159 Type 2 diabetes mellitus with other circulatory complications: Secondary | ICD-10-CM | POA: Diagnosis not present

## 2023-01-30 DIAGNOSIS — I152 Hypertension secondary to endocrine disorders: Secondary | ICD-10-CM

## 2023-01-30 DIAGNOSIS — Z87891 Personal history of nicotine dependence: Secondary | ICD-10-CM

## 2023-01-30 DIAGNOSIS — E1169 Type 2 diabetes mellitus with other specified complication: Secondary | ICD-10-CM | POA: Diagnosis not present

## 2023-01-30 DIAGNOSIS — R002 Palpitations: Secondary | ICD-10-CM

## 2023-01-30 DIAGNOSIS — E785 Hyperlipidemia, unspecified: Secondary | ICD-10-CM

## 2023-01-30 NOTE — Telephone Encounter (Signed)
Patient called and stated she needs  Dr B to send over Ultrasound request to [facility] in [place]. Patient stated [facility] in [place] can get patient in on 02/01/2023 if the order is sent.  Patients callback # [phone]

## 2023-01-30 NOTE — Telephone Encounter (Signed)
Order has been sent to [facility].

## 2023-01-30 NOTE — Patient Instructions (Addendum)
Medication Instructions:  No changes  If you need a refill on your cardiac medications before your next appointment, please call your pharmacy.   Lab work: No new labs needed  Testing/Procedures: No new testing needed  Follow-Up: At [facility], you and your health needs are our priority.  As part of our continuing mission to provide you with exceptional heart care, we have created designated Provider Care Teams.  These Care Teams include your primary Cardiologist (physician) and Advanced Practice Providers (APPs -  Physician Assistants and Nurse Practitioners) who all work together to provide you with the care you need, when you need it.  You will need a follow up 24 months.  Providers on your designated Care Team:   [name], NP [name], PA-C Cadence Fransico Michael, [place]  COVID-19 Vaccine Information can be found at: [email or web address] For questions related to vaccine distribution or appointments, please email [email or web address] or call [phone].

## 2023-02-01 ENCOUNTER — Ambulatory Visit
Admission: RE | Admit: 2023-02-01 | Discharge: 2023-02-01 | Disposition: A | Discharge status: Home / Self Care | Payer: BC Managed Care – PPO | Source: Ambulatory Visit | Attending: Family Medicine | Admitting: Family Medicine

## 2023-02-01 DIAGNOSIS — R1030 Lower abdominal pain, unspecified: Secondary | ICD-10-CM

## 2023-02-01 DIAGNOSIS — R10819 Abdominal tenderness, unspecified site: Secondary | ICD-10-CM | POA: Diagnosis not present

## 2023-02-07 ENCOUNTER — Ambulatory Visit: Payer: BC Managed Care – PPO

## 2023-02-09 ENCOUNTER — Encounter: Payer: Self-pay | Admitting: Family Medicine

## 2023-02-09 NOTE — Telephone Encounter (Signed)
This hasn't been read by radiology yet and it was 8 days ago. Can we called to see if it can be read please? Let the patient know we are checking on it, but we don't have results yet.

## 2023-03-30 ENCOUNTER — Ambulatory Visit
Admission: RE | Admit: 2023-03-30 | Discharge: 2023-03-30 | Disposition: A | Discharge status: Home / Self Care | Payer: BC Managed Care – PPO | Source: Ambulatory Visit | Attending: Acute Care | Admitting: Acute Care

## 2023-03-30 DIAGNOSIS — R911 Solitary pulmonary nodule: Secondary | ICD-10-CM | POA: Diagnosis not present

## 2023-03-30 DIAGNOSIS — Z87891 Personal history of nicotine dependence: Secondary | ICD-10-CM | POA: Insufficient documentation

## 2023-04-13 DIAGNOSIS — D2262 Melanocytic nevi of left upper limb, including shoulder: Secondary | ICD-10-CM | POA: Diagnosis not present

## 2023-04-13 DIAGNOSIS — D225 Melanocytic nevi of trunk: Secondary | ICD-10-CM | POA: Diagnosis not present

## 2023-04-13 DIAGNOSIS — L821 Other seborrheic keratosis: Secondary | ICD-10-CM | POA: Diagnosis not present

## 2023-04-13 DIAGNOSIS — D2261 Melanocytic nevi of right upper limb, including shoulder: Secondary | ICD-10-CM | POA: Diagnosis not present

## 2023-04-28 ENCOUNTER — Telehealth: Payer: Self-pay | Admitting: *Deleted

## 2023-04-28 ENCOUNTER — Telehealth: Payer: Self-pay | Admitting: Acute Care

## 2023-04-28 DIAGNOSIS — Z87891 Personal history of nicotine dependence: Secondary | ICD-10-CM

## 2023-04-28 DIAGNOSIS — R911 Solitary pulmonary nodule: Secondary | ICD-10-CM

## 2023-04-28 NOTE — Telephone Encounter (Signed)
Call report confirmed:  IMPRESSION: 1. 9 mm left upper lobe nodule is stable. Lung-RADS 3, probably benign findings. Short-term follow-up in 6 months is recommended with repeat low-dose chest CT without contrast (please use the following order, "CT CHEST LCS NODULE FOLLOW-UP W/O CM"). These results will be called to the ordering clinician or representative by the Radiologist Assistant, and communication documented in the PACS or [employer]. 2. Age advanced coronary artery calcification. 3. Central bronchiectasis. 4. Cholelithiasis. 5.  Emphysema (ICD10-J43.9).

## 2023-04-28 NOTE — Telephone Encounter (Signed)
Spoke with pt and advised that nodule seen on previous scan has remained stable in size and we would like to repeat the scan in 6 months to make sure that it remains stable. Pt verbalized understanding. Results/ plans faxed to PCP. Order placed for 6 month nodule f/u CT.

## 2023-04-28 NOTE — Telephone Encounter (Signed)
Tiffany calling with call report. For CT scan.Tiffany phone number is [phone]. 

## 2023-05-22 ENCOUNTER — Ambulatory Visit: Payer: BC Managed Care – PPO | Admitting: Family Medicine

## 2023-05-22 ENCOUNTER — Encounter: Payer: Self-pay | Admitting: Family Medicine

## 2023-05-22 VITALS — BP 116/66 | HR 75 | Ht 66.0 in | Wt 238.3 lb

## 2023-05-22 DIAGNOSIS — M1712 Unilateral primary osteoarthritis, left knee: Secondary | ICD-10-CM | POA: Diagnosis not present

## 2023-05-22 DIAGNOSIS — E785 Hyperlipidemia, unspecified: Secondary | ICD-10-CM

## 2023-05-22 DIAGNOSIS — E1159 Type 2 diabetes mellitus with other circulatory complications: Secondary | ICD-10-CM | POA: Diagnosis not present

## 2023-05-22 DIAGNOSIS — I152 Hypertension secondary to endocrine disorders: Secondary | ICD-10-CM

## 2023-05-22 DIAGNOSIS — E1169 Type 2 diabetes mellitus with other specified complication: Secondary | ICD-10-CM | POA: Diagnosis not present

## 2023-05-22 DIAGNOSIS — Z7985 Long-term (current) use of injectable non-insulin antidiabetic drugs: Secondary | ICD-10-CM

## 2023-05-22 DIAGNOSIS — Z7984 Long term (current) use of oral hypoglycemic drugs: Secondary | ICD-10-CM

## 2023-05-22 LAB — POCT GLYCOSYLATED HEMOGLOBIN (HGB A1C): Hemoglobin A1C: 5.6 % (ref 4.0–5.6)

## 2023-05-22 MED ORDER — TIRZEPATIDE 10 MG/0.5ML ~~LOC~~ SOAJ: 10.0000 mg | SUBCUTANEOUS | 1 refills | Status: DC

## 2023-05-22 MED ORDER — TIRZEPATIDE 5 MG/0.5ML ~~LOC~~ SOAJ: 5.0000 mg | SUBCUTANEOUS | 0 refills | Status: AC

## 2023-05-22 NOTE — Assessment & Plan Note (Signed)
Well-controlled with current A1c at 5.6%. Off Mounjaro for six weeks due to injection site reactions, leading to increased cravings and slight rise in A1c from 5.1%. Prefers to restart Mounjaro due to its efficacy in controlling cravings and blood sugar levels. Explained that local reactions are common and not indicative of a systemic allergy. Plan to restart at a lower dose to avoid nausea and gradually increase. - Restart Mounjaro at 5 mg weekly for one month, then increase to 10 mg weekly - Discontinue metformin after current prescription is finished - Use hydrocortisone cream for injection site reactions - Rotate injection sites - Monitor for systemic allergic reactions (e.g., hives, whole-body symptoms) - Message via MyChart if experiencing significant nausea or other adverse effects

## 2023-05-22 NOTE — Assessment & Plan Note (Signed)
Well-controlled. Current blood pressure is within normal limits. - Continue current antihypertensive regimen

## 2023-05-22 NOTE — Progress Notes (Signed)
Established patient visit   [name]   DOB: [birth year]   57 y.o. Female  MRN: [id number] Visit Date: 05/22/2023  Today's healthcare provider: Shirlee Latch, MD   Chief Complaint  Patient presents with   Medical Management of Chronic Issues   Diabetes    Patient reports mounjaro caused rash. States it started out size of a dime and increased in size with each injection to the point that it was the size of a half dollar. She reoprts stopping the injection about 6 weeks ago   Hyperlipidemia   Immunizations    Declined pneumo   Subjective    Diabetes  Hyperlipidemia   HPI     Diabetes   Recent episode started more than a year ago.  Compliance with treatment is excellent.  Blurred vision: Absent.  Chest pain: Absent.  Fatigue: Absent.  Foot Ulcerations: Absent.  Nausea: Absent.  Paresthesia of the feet: Absent.  Polydipsia: Absent.  Polyuria: Absent.  Visual changes: Present.  Vomiting: Absent.  Weight loss: Absent.  Episodes of hypoglycemia: Absent.  Eye exam is current. Additional comments: Patient reports mounjaro caused rash. States it started out size of a dime and increased in size with each injection to the point that it was the size of a half dollar. She reoprts stopping the injection about 6 weeks ago        Immunizations    Additional comments: Declined pneumo        Comments   Foot exam- Aware to remove sock and shoes      Last edited by Acey Lav, CMA on 05/22/2023  8:17 AM.       Discussed the use of AI scribe software for clinical note transcription with the patient, who gave verbal consent to proceed.  History of Present Illness   A 57 year old patient with a history of type two diabetes, hypertension, hyperlipidemia, and morbid obesity presents for a routine follow-up. The patient reports recently recovering from an illness and has been off Mounjaro for about six weeks due to a rash at the injection site. The patient also  reports a fall that resulted in a twisted knee and is requesting a steroid injection for the pain. The patient's A1c is 5.6, and there is a discussion about discontinuing metformin. The patient also has a nodule in the left upper lobe of the lung that is being monitored.           05/22/2023    8:18 AM 01/26/2023    3:52 PM 06/10/2022    8:06 AM 12/28/2021    8:34 AM 11/30/2021    9:30 AM  Depression screen PHQ 2/9  Decreased Interest 0 0 0 1 1  Down, Depressed, Hopeless 0 0 0 0 0  PHQ - 2 Score 0 0 0 1 1  Altered sleeping 2 2 2  3   Tired, decreased energy 2 2 3  2   Change in appetite 3 0 0  2  Feeling bad or failure about yourself  0 0 0  0  Trouble concentrating 0 0 0  1  Moving slowly or fidgety/restless 0 0 0  0  Suicidal thoughts 0 0 0  0  PHQ-9 Score 7 4 5  9   Difficult doing work/chores Not difficult at all Not difficult at all   Not difficult at all      05/22/2023    8:18 AM 01/26/2023    3:52 PM 03/21/2019    8:55  AM  GAD 7 : Generalized Anxiety Score  Nervous, Anxious, on Edge 0 1 1  Control/stop worrying 0 0 1  Worry too much - different things 0 0 0  Trouble relaxing 1 0 0  Restless 1 0 0  Easily annoyed or irritable 2 1 3   Afraid - awful might happen 0 0 0  Total GAD 7 Score 4 2 5   Anxiety Difficulty Not difficult at all Not difficult at all Not difficult at all     Medications: Outpatient Medications Prior to Visit  Medication Sig Note   atorvastatin (LIPITOR) 20 MG tablet Take 1 tablet (20 mg total) by mouth daily.    Cholecalciferol (VITAMIN D3) 125 MCG (5000 UT) CAPS Take 1 capsule by mouth daily.    citalopram (CELEXA) 40 MG tablet TAKE 1 TABLET BY MOUTH EVERY DAY    fluticasone (FLONASE) 50 MCG/ACT nasal spray Place 2 sprays into both nostrils daily. (Patient taking differently: Place 2 sprays into both nostrils daily as needed.)    Omega-3 Fatty Acids (FISH OIL) 1000 MG CAPS Take 1 capsule by mouth daily.    omeprazole (PRILOSEC) 20 MG capsule Take 20  mg by mouth daily.    traZODone (DESYREL) 150 MG tablet TAKE 1 TABLET (150 MG TOTAL) BY MOUTH AT BEDTIME. (Patient taking differently: Take 150 mg by mouth at bedtime as needed.)    [DISCONTINUED] metFORMIN (GLUCOPHAGE) 500 MG tablet TAKE TWO TABLETS BY MOUTH TWICE A DAY    [DISCONTINUED] tirzepatide (MOUNJARO) 10 MG/0.5ML Pen Inject 10 mg into the skin once a week. (Patient not taking: Reported on 05/22/2023) 05/22/2023: Stopped about 6 weeks ago due to rash   No facility-administered medications prior to visit.    Review of Systems     Objective    BP 116/66   Pulse 75   Ht 5\' 6"  (1.676 m)   Wt 238 lb 4.8 oz (108.1 kg)   SpO2 100%   BMI 38.46 kg/m    Physical Exam Vitals reviewed.  Constitutional:      General: She is not in acute distress.    Appearance: Normal appearance. She is well-developed. She is not diaphoretic.  HENT:     Head: Normocephalic and atraumatic.  Eyes:     General: No scleral icterus.    Conjunctiva/sclera: Conjunctivae normal.  Neck:     Thyroid: No thyromegaly.  Cardiovascular:     Rate and Rhythm: Normal rate and regular rhythm.     Heart sounds: Normal heart sounds. No murmur heard. Pulmonary:     Effort: Pulmonary effort is normal. No respiratory distress.     Breath sounds: Normal breath sounds. No wheezing, rhonchi or rales.  Musculoskeletal:     Cervical back: Neck supple.     Right lower leg: No edema.     Left lower leg: No edema.     Comments: L knee: lateral joint line TTP, Ligaments with strong endpoints. Small effusion.  Lymphadenopathy:     Cervical: No cervical adenopathy.  Skin:    General: Skin is warm and dry.     Findings: No rash.  Neurological:     Mental Status: She is alert and oriented to person, place, and time. Mental status is at baseline.  Psychiatric:        Mood and Affect: Mood normal.        Behavior: Behavior normal.      Results for orders placed or performed in visit on 05/22/23  HM MAMMOGRAPHY  Result Value Ref Range   HM Mammogram 0-4 Bi-Rad 0-4 Bi-Rad, Self Reported Normal  POCT HgB A1C  Result Value Ref Range   Hemoglobin A1C 5.6 4.0 - 5.6 %   HbA1c POC (<> result, manual entry)     HbA1c, POC (prediabetic range)     HbA1c, POC (controlled diabetic range)      Assessment & Plan     Problem List Items Addressed This Visit       Cardiovascular and Mediastinum   Hypertension associated with diabetes (HCC)   Well-controlled. Current blood pressure is within normal limits. - Continue current antihypertensive regimen      Relevant Medications   tirzepatide (MOUNJARO) 5 MG/0.5ML Pen   tirzepatide (MOUNJARO) 10 MG/0.5ML Pen     Endocrine   Hyperlipidemia associated with type 2 diabetes mellitus (HCC)   Well-controlled on atorvastatin. Recent cholesterol levels are within target range. Atorvastatin also reduces risk of heart attack and stroke related to coronary artery calcification. - Continue atorvastatin 20 mg daily      Relevant Medications   tirzepatide (MOUNJARO) 5 MG/0.5ML Pen   tirzepatide (MOUNJARO) 10 MG/0.5ML Pen   T2DM (type 2 diabetes mellitus) (HCC) - Primary   Well-controlled with current A1c at 5.6%. Off Mounjaro for six weeks due to injection site reactions, leading to increased cravings and slight rise in A1c from 5.1%. Prefers to restart Mounjaro due to its efficacy in controlling cravings and blood sugar levels. Explained that local reactions are common and not indicative of a systemic allergy. Plan to restart at a lower dose to avoid nausea and gradually increase. - Restart Mounjaro at 5 mg weekly for one month, then increase to 10 mg weekly - Discontinue metformin after current prescription is finished - Use hydrocortisone cream for injection site reactions - Rotate injection sites - Monitor for systemic allergic reactions (e.g., hives, whole-body symptoms) - Message via MyChart if experiencing significant nausea or other adverse effects       Relevant Medications   tirzepatide (MOUNJARO) 5 MG/0.5ML Pen   tirzepatide (MOUNJARO) 10 MG/0.5ML Pen   Other Relevant Orders   POCT HgB A1C (Completed)     Musculoskeletal and Integument   Osteoarthritis of knee   Knee Pain Left knee pain with effusion, likely due to recent fall. No recent steroid injection in over a year. Pain localized to the medial and lateral aspects of the knee. Discussed the benefits and risks of steroid injection, including potential relief duration and risks of bleeding and infection. - Administer steroid injection to left knee - Advise against overexertion while numbing medication is active - Monitor for relief over the next few days, with expected improvement within a day or so - Seek medical attention if symptoms worsen or do not improve  INJECTION: Patient was given informed consent,. Appropriate time out was taken. Area prepped and draped in usual sterile fashion. 1 cc of depo-medrol 40 mg/ml plus  4 cc of 1% lidocaine without epinephrine was injected into the L knee using a(n) anterolateral approach. The patient tolerated the procedure well. There were no complications. Post procedure instructions were given.         Other   Morbid obesity (HCC)   Contributing to overall health risks including diabetes and hypertension. Greggory Keen has been effective in managing weight and cravings. - Restart Mounjaro as per diabetes management plan      Relevant Medications   tirzepatide (MOUNJARO) 5 MG/0.5ML Pen   tirzepatide (MOUNJARO) 10 MG/0.5ML Pen  General Health Maintenance Discussed recent lab results and follow-up for lung nodule. No need for additional lab work today. - Follow up on lung nodule in six months - Continue current medications and lifestyle modifications  Follow-up - Follow up on lung nodule in six months - Message via MyChart if experiencing significant nausea or other adverse effects from [facility] - Recheck A1c and other relevant  labs in future visits as needed.         Return in about 6 months (around 11/20/2023) for CPE.       Shirlee Latch, MD  [facility] [phone] (phone) [phone] (fax)  [facility]

## 2023-05-22 NOTE — Assessment & Plan Note (Signed)
Contributing to overall health risks including diabetes and hypertension. Heather Orr has been effective in managing weight and cravings. - Restart Mounjaro as per diabetes management plan

## 2023-05-22 NOTE — Assessment & Plan Note (Signed)
Knee Pain Left knee pain with effusion, likely due to recent fall. No recent steroid injection in over a year. Pain localized to the medial and lateral aspects of the knee. Discussed the benefits and risks of steroid injection, including potential relief duration and risks of bleeding and infection. - Administer steroid injection to left knee - Advise against overexertion while numbing medication is active - Monitor for relief over the next few days, with expected improvement within a day or so - Seek medical attention if symptoms worsen or do not improve  INJECTION: Patient was given informed consent,. Appropriate time out was taken. Area prepped and draped in usual sterile fashion. 1 cc of depo-medrol 40 mg/ml plus  4 cc of 1% lidocaine without epinephrine was injected into the L knee using a(n) anterolateral approach. The patient tolerated the procedure well. There were no complications. Post procedure instructions were given.

## 2023-05-22 NOTE — Assessment & Plan Note (Signed)
Well-controlled on atorvastatin. Recent cholesterol levels are within target range. Atorvastatin also reduces risk of heart attack and stroke related to coronary artery calcification. - Continue atorvastatin 20 mg daily

## 2023-07-27 ENCOUNTER — Other Ambulatory Visit: Payer: Self-pay

## 2023-07-27 ENCOUNTER — Telehealth: Payer: Self-pay | Admitting: Family Medicine

## 2023-07-27 DIAGNOSIS — E785 Hyperlipidemia, unspecified: Secondary | ICD-10-CM

## 2023-07-27 MED ORDER — ATORVASTATIN CALCIUM 20 MG PO TABS: 20.0000 mg | ORAL_TABLET | Freq: Every day | ORAL | 3 refills | Status: AC

## 2023-07-27 NOTE — Telephone Encounter (Signed)
 [facility] pharmacy faxed refill request for the following medications:   atorvastatin (LIPITOR) 20 MG tablet    Please advise

## 2023-09-06 ENCOUNTER — Ambulatory Visit: Payer: Self-pay

## 2023-09-06 ENCOUNTER — Ambulatory Visit
Admission: RE | Admit: 2023-09-06 | Discharge: 2023-09-06 | Disposition: A | Discharge status: Home / Self Care | Source: Ambulatory Visit | Attending: Family Medicine | Admitting: Family Medicine

## 2023-09-06 ENCOUNTER — Ambulatory Visit: Admitting: Family Medicine

## 2023-09-06 ENCOUNTER — Encounter: Payer: Self-pay | Admitting: Family Medicine

## 2023-09-06 VITALS — BP 112/68 | HR 79 | Temp 98.6°F | Resp 20 | Ht 66.0 in | Wt 233.5 lb

## 2023-09-06 DIAGNOSIS — R1012 Left upper quadrant pain: Secondary | ICD-10-CM | POA: Diagnosis not present

## 2023-09-06 DIAGNOSIS — K5733 Diverticulitis of large intestine without perforation or abscess with bleeding: Secondary | ICD-10-CM | POA: Diagnosis not present

## 2023-09-06 DIAGNOSIS — R197 Diarrhea, unspecified: Secondary | ICD-10-CM | POA: Diagnosis not present

## 2023-09-06 DIAGNOSIS — R1032 Left lower quadrant pain: Secondary | ICD-10-CM | POA: Insufficient documentation

## 2023-09-06 DIAGNOSIS — R1030 Lower abdominal pain, unspecified: Secondary | ICD-10-CM

## 2023-09-06 LAB — POC URINALSYSI DIPSTICK (AUTOMATED)
Bilirubin, UA: NEGATIVE
Blood, UA: NEGATIVE
Glucose, UA: NEGATIVE
Ketones, UA: NEGATIVE
Leukocytes, UA: NEGATIVE
Nitrite, UA: NEGATIVE
Protein, UA: NEGATIVE
Spec Grav, UA: 1.01 (ref 1.010–1.025)
Urobilinogen, UA: 0.2 U/dL
pH, UA: 6 (ref 5.0–8.0)

## 2023-09-06 LAB — COMPREHENSIVE METABOLIC PANEL WITH GFR
ALT: 27 U/L (ref 0–35)
AST: 17 U/L (ref 0–37)
Albumin: 4.3 g/dL (ref 3.5–5.2)
Alkaline Phosphatase: 101 U/L (ref 39–117)
BUN: 10 mg/dL (ref 6–23)
CO2: 25 meq/L (ref 19–32)
Calcium: 9.3 mg/dL (ref 8.4–10.5)
Chloride: 104 meq/L (ref 96–112)
Creatinine, Ser: 0.54 mg/dL (ref 0.40–1.20)
GFR: 101.32 mL/min (ref >60.00)
Glucose, Bld: 87 mg/dL (ref 70–99)
Potassium: 4.3 meq/L (ref 3.5–5.1)
Sodium: 139 meq/L (ref 135–145)
Total Bilirubin: 1.2 mg/dL (ref 0.2–1.2)
Total Protein: 6.6 g/dL (ref 6.0–8.3)

## 2023-09-06 LAB — CBC WITH DIFFERENTIAL/PLATELET
Basophils Absolute: 0.1 10*3/uL (ref 0.0–0.1)
Basophils Relative: 0.7 % (ref 0.0–3.0)
Eosinophils Absolute: 0.2 10*3/uL (ref 0.0–0.7)
Eosinophils Relative: 1.5 % (ref 0.0–5.0)
HCT: 42.9 % (ref 36.0–46.0)
Hemoglobin: 14.4 g/dL (ref 12.0–15.0)
Lymphocytes Relative: 37.9 % (ref 12.0–46.0)
Lymphs Abs: 4.2 10*3/uL — ABNORMAL HIGH (ref 0.7–4.0)
MCHC: 33.5 g/dL (ref 30.0–36.0)
MCV: 89.5 fl (ref 78.0–100.0)
Monocytes Absolute: 0.9 10*3/uL (ref 0.1–1.0)
Monocytes Relative: 7.8 % (ref 3.0–12.0)
Neutro Abs: 5.7 10*3/uL (ref 1.4–7.7)
Neutrophils Relative %: 52.1 % (ref 43.0–77.0)
Platelets: 336 10*3/uL (ref 150.0–400.0)
RBC: 4.8 Mil/uL (ref 3.87–5.11)
RDW: 13.9 % (ref 11.5–15.5)
WBC: 10.9 10*3/uL — ABNORMAL HIGH (ref 4.0–10.5)

## 2023-09-06 LAB — LIPASE: Lipase: 25 U/L (ref 11.0–59.0)

## 2023-09-06 NOTE — Assessment & Plan Note (Addendum)
 Possible diverticulitis flare-up with atypical upper abdominal pain. Non bloody diarrhea improving today.  No mucus in diarrhea.  Majority of intake has been take out food this past weekend.  - Collect stool sample - Cmet and CBC today

## 2023-09-06 NOTE — Patient Instructions (Addendum)
 It was a pleasure meeting you today. Thank you for allowing me to take part in your health care.  Our goals for today as we discussed include:  We will get some labs today.  If they are abnormal or we need to do something about them, I will call you.  If they are normal, I will send you a message on MyChart (if it is active) or a letter in the mail.  If you don't hear from us  in 2 weeks, please call the office at the number below.   Will get imaging of abdomen today.  Will notify you of results and management  Urine negative for infection.  This is a list of the screening recommended for you and due dates:  Health Maintenance  Topic Date Due   Pneumococcal Vaccination (1 of 2 - PCV) Never done   COVID-19 Vaccine (2 - 2024-25 season) 01/22/2023   Yearly kidney function blood test for diabetes  11/15/2023   Yearly kidney health urinalysis for diabetes  11/15/2023   Hemoglobin A1C  11/20/2023   Mammogram  12/12/2023   Eye exam for diabetics  12/15/2023   Flu Shot  12/22/2023   Screening for Lung Cancer  03/29/2024   Complete foot exam   05/21/2024   DTaP/Tdap/Td vaccine (3 - Td or Tdap) 05/19/2025   Colon Cancer Screening  11/11/2026   Pap with HPV screening  11/15/2027   Hepatitis C Screening  Completed   HIV Screening  Completed   Zoster (Shingles) Vaccine  Completed   HPV Vaccine  Aged Out   Meningitis B Vaccine  Aged Out     If you have any questions or concerns, please do not hesitate to call the office at [phone].  I look forward to our next visit and until then take care and stay safe.  Regards,   Valli Gaw, MD   [facility]

## 2023-09-06 NOTE — Telephone Encounter (Signed)
 Chief Complaint: abdominal pain Symptoms: left middle abdominal pain, black diarrhea (on Pepto Bismol), belching, gas Frequency: constant x 24 hours Pertinent Negatives: Patient denies blood in stool, fever, chest pain, nausea, vomiting Disposition: [] ED /[] Urgent Care (no appt availability in office) / [x] Appointment(In office/virtual)/ []  [facility]/ [] Home Care/ [] Refused Recommended Disposition /[] [facility]/ []  Follow-up with PCP Additional Notes: Patient states she took some Gas-X last night which relieved some of the symptoms. As well she mentions she was taking Pepto Bismol Saturday thru Monday. Patient states she had some dark/black diarrhea since Pepto Bismol use. Patient states yesterday the pain was severe and she went to urgent care. No available appts with PCP or [facility]. Patient agreeable to acute visit with [facility] this morning.  Copied from CRM [id number]. Topic: Clinical - Red Word Triage >> Sep 06, 2023  7:51 AM Carlatta H wrote: [facility] that prompted transfer to Nurse Triage: Patient has had stomach pain for about 2 weeks that has gotten worse//Pain is more so on the left side//Diarrhea for 3 days Reason for Disposition  [1] MILD-MODERATE pain AND [2] constant AND [3] present > 2 hours  Answer Assessment - Initial Assessment Questions 1. LOCATION: "Where does it hurt?"      Left side, middle near belly button.  2. RADIATION: "Does the pain shoot anywhere else?" (e.g., chest, back)     Radiates to the right side of abdomen.  3. ONSET: "When did the pain begin?" (e.g., minutes, hours or days ago)      Yesterday around 8:30-9 am and has gradually worsened.  4. SUDDEN: "Gradual or sudden onset?"     Gradual.  5. PATTERN "Does the pain come and go, or is it constant?"    - If it comes and goes: "How long does it last?" "Do you have pain now?"     (Note: Comes and goes means the pain is intermittent. It goes away completely between  bouts.)    - If constant: "Is it getting better, staying the same, or getting worse?"      (Note: Constant means the pain never goes away completely; most serious pain is constant and gets worse.)      Constant. She states yesterday was so bad she went to the "walk in clinic" and they did a urine sample. She states it has let up some since yesterday.  6. SEVERITY: "How bad is the pain?"  (e.g., Scale 1-10; mild, moderate, or severe)    - MILD (1-3): Doesn't interfere with normal activities, abdomen soft and not tender to touch.     - MODERATE (4-7): Interferes with normal activities or awakens from sleep, abdomen tender to touch.     - SEVERE (8-10): Excruciating pain, doubled over, unable to do any normal activities.       6/10. She states yesterday was severe.  7. RECURRENT SYMPTOM: "Have you ever had this type of stomach pain before?" If Yes, ask: "When was the last time?" and "What happened that time?"      Yes. The last diverticulitis flare up was about a year ago.  8. CAUSE: "What do you think is causing the stomach pain?"     Patient states she has a history of diverticulitis. She states the pain was lower than this pain.  9. RELIEVING/AGGRAVATING FACTORS: "What makes it better or worse?" (e.g., antacids, bending or twisting motion, bowel movement)     Denies.  10. OTHER SYMPTOMS: "Do you have any other  symptoms?" (e.g., back pain, diarrhea, fever, urination pain, vomiting)       Foul smelling burping, gas, black diarrhea (1-2 times more than usual x several days, taking Pepto Bismol), difficulty breathing yesterday, sharp intermittent pain under left breast yesterday.  11. PREGNANCY: "Is there any chance you are pregnant?" "When was your last menstrual period?"       N/A.  Protocols used: Abdominal Pain - Female-A-AH

## 2023-09-06 NOTE — Addendum Note (Signed)
 Addended by: Purva Vessell M on: 09/06/2023 10:43 AM   Modules accepted: Orders

## 2023-09-06 NOTE — Assessment & Plan Note (Signed)
 Acute left upper and lower abdominal pain with excessive gas and burping. Differential includes diverticulitis, gastroenteritis, splenic infarct. Diarrhea for three days, possibly linked to recent dietary intake.  Pain elicited on abdominal exam LUQ > LLQ.   - Order CT scan of the abdomen/pelvis - Perform blood work for infection or inflammation. - Collect stool sample to rule out infection.

## 2023-09-06 NOTE — Progress Notes (Signed)
 Noted.

## 2023-09-06 NOTE — Progress Notes (Addendum)
 SUBJECTIVE:   Chief Complaint  Patient presents with   Abdominal Pain    Middle left side started yesterday   HPI Presents for acute visit  Discussed the use of AI scribe software for clinical note transcription with the patient, who gave verbal consent to proceed.  History of Present Illness Heather Orr "Heather Orr" is a 58 year old female with diverticulitis who presents with abdominal pain, excessive gas, and diarrhea.  She has been experiencing intense abdominal pain primarily located in the upper abdomen and slightly on the left side since yesterday. The pain does not improve after bowel movements. She also reports excessive gas and burping.  She has had diarrhea for approximately three to three and a half days, starting on Saturday evening or Sunday. The diarrhea is slightly improving, with the most recent bowel movement being more solid than previous ones. No blood or mucus has been noticed in the stool.  She has a history of diverticulitis and suspects it may be flaring up again, although the current pain is in a different location than previous episodes. No fever, nausea, or vomiting has been noted.  She took Gas-X last night, which helped with the bloating but not the pain. She is currently on Prilosec, which has not alleviated her symptoms. She is also on Mounjaro 10 mg, which she did not take this morning as scheduled.  Her diet over the weekend included fish tacos from a restaurant on Friday, a bacon, egg, and cheese bagel from [employer] on Saturday morning, and [place] food (sesame chicken) for lunch and dinner on Saturday, with leftovers on Sunday. She estimates 75% of her diet consists of takeout food.  No recent travel, fever, or urinary problems. She has a history of hip replacement surgery six years ago but denies any recent hip pain or injuries. She also mentions having degenerative disc disease and pinched nerves in her back.    PERTINENT PMH / PSH: As  above  OBJECTIVE:  BP 112/68   Pulse 79   Temp 98.6 F (37 C)   Resp 20   Ht 5\' 6"  (1.676 m)   Wt 233 lb 8 oz (105.9 kg)   SpO2 98%   BMI 37.69 kg/m    Physical Exam Vitals reviewed.  Constitutional:      General: She is not in acute distress.    Appearance: She is obese. She is not ill-appearing.  Cardiovascular:     Rate and Rhythm: Normal rate.  Pulmonary:     Effort: Pulmonary effort is normal.  Abdominal:     General: Bowel sounds are increased. There is no distension or abdominal bruit.     Palpations: Abdomen is soft. There is no fluid wave, hepatomegaly, splenomegaly, mass or pulsatile mass.     Tenderness: There is abdominal tenderness in the epigastric area, left upper quadrant and left lower quadrant. There is guarding. There is no right CVA tenderness, left CVA tenderness or rebound. Negative signs include Murphy's sign and McBurney's sign.     Hernia: No hernia is present.  Skin:    Coloration: Skin is not jaundiced.  Neurological:     Mental Status: She is alert. Mental status is at baseline.  Psychiatric:        Mood and Affect: Mood normal.        Behavior: Behavior normal.        Thought Content: Thought content normal.        Judgment: Judgment normal.  09/06/2023    9:47 AM 05/22/2023    8:18 AM 01/26/2023    3:52 PM 06/10/2022    8:06 AM 12/28/2021    8:34 AM  Depression screen PHQ 2/9  Decreased Interest 0 0 0 0 1  Down, Depressed, Hopeless 0 0 0 0 0  PHQ - 2 Score 0 0 0 0 1  Altered sleeping 3 2 2 2    Tired, decreased energy 3 2 2 3    Change in appetite 1 3 0 0   Feeling bad or failure about yourself  0 0 0 0   Trouble concentrating 1 0 0 0   Moving slowly or fidgety/restless 0 0 0 0   Suicidal thoughts 0 0 0 0   PHQ-9 Score 8 7 4 5    Difficult doing work/chores Not difficult at all Not difficult at all Not difficult at all        09/06/2023    9:47 AM 05/22/2023    8:18 AM 01/26/2023    3:52 PM 03/21/2019    8:55 AM  GAD 7 :  Generalized Anxiety Score  Nervous, Anxious, on Edge 0 0 1 1  Control/stop worrying 1 0 0 1  Worry too much - different things 1 0 0 0  Trouble relaxing 1 1 0 0  Restless 0 1 0 0  Easily annoyed or irritable 3 2 1 3   Afraid - awful might happen 0 0 0 0  Total GAD 7 Score 6 4 2 5   Anxiety Difficulty Not difficult at all Not difficult at all Not difficult at all Not difficult at all    ASSESSMENT/PLAN:  LUQ abdominal pain Assessment & Plan: Acute left upper and lower abdominal pain with excessive gas and burping. Differential includes diverticulitis, gastroenteritis, splenic infarct. Diarrhea for three days, possibly linked to recent dietary intake.  Pain elicited on abdominal exam LUQ > LLQ.   - Order CT scan of the abdomen/pelvis - Perform blood work for infection or inflammation. - Collect stool sample to rule out infection.   Orders: -     CBC with Differential/Platelet -     Comprehensive metabolic panel with GFR -     Lipase -     CT ABDOMEN PELVIS WO CONTRAST  LLQ abdominal pain -     POCT Urinalysis Dipstick (Automated)  Diarrhea, unspecified type Assessment & Plan: Possible diverticulitis flare-up with atypical upper abdominal pain. Non bloody diarrhea improving today.  No mucus in diarrhea.  Majority of intake has been take out food this past weekend.  - Collect stool sample - Cmet and CBC today    Orders: -     GI pathogen panel by PCR, stool; Future  Lower abdominal pain -     CT ABDOMEN PELVIS WO CONTRAST    PDMP reviewed  Return if symptoms worsen or fail to improve, for PCP.  Valli Gaw, MD

## 2023-09-07 ENCOUNTER — Ambulatory Visit: Payer: Self-pay | Admitting: Family Medicine

## 2023-09-07 ENCOUNTER — Other Ambulatory Visit: Payer: Self-pay | Admitting: Family Medicine

## 2023-09-07 DIAGNOSIS — K5792 Diverticulitis of intestine, part unspecified, without perforation or abscess without bleeding: Secondary | ICD-10-CM

## 2023-09-07 MED ORDER — CIPROFLOXACIN HCL 500 MG PO TABS: 500.0000 mg | ORAL_TABLET | Freq: Two times a day (BID) | ORAL | 0 refills | Status: AC

## 2023-09-07 MED ORDER — KETOROLAC TROMETHAMINE 10 MG PO TABS: 10.0000 mg | ORAL_TABLET | Freq: Four times a day (QID) | ORAL | 0 refills | Status: AC | PRN

## 2023-09-07 MED ORDER — METRONIDAZOLE 500 MG PO TABS
500.0000 mg | ORAL_TABLET | Freq: Three times a day (TID) | ORAL | 0 refills | Status: AC
Start: 2023-09-07 — End: 2023-09-14

## 2023-09-07 MED ORDER — SACCHAROMYCES BOULARDII 250 MG PO CAPS: 250.0000 mg | ORAL_CAPSULE | Freq: Every day | ORAL | 0 refills | Status: DC

## 2023-09-07 NOTE — Telephone Encounter (Signed)
 It looks like provider that saw her for acute visit yesterday has already taken care of this and sent abx to the pharmacy.

## 2023-09-07 NOTE — Telephone Encounter (Signed)
 Noted.

## 2023-09-07 NOTE — Telephone Encounter (Signed)
 Chief Complaint: Pain in LUQ abdomen for 2 days  Symptoms: 8/10 pain constant, diarrhea for about 3 days but now more solid now Pertinent Negatives: Patient denies radiation, fever urination pain, vomiting  Disposition: [x] ED [x]  Follow-up with PCP  Additional Notes: Pt was seen in office yesterday by Dr. Sueanne Emerald for LUQ abdominal pain. Pt had blood work done and a CT scan. This RN advised pt to go to ED but pt declined. This RN notified [facility] of pt refusal of ED disposition.   Copied from CRM [id number]. Topic: Clinical - Red Word Triage >> Sep 07, 2023 10:19 AM Juluis Ok wrote: [facility] that prompted transfer to Nurse Triage: stomach pain 8/10 Reason for Disposition  [1] SEVERE pain (e.g., excruciating) AND [2] present > 1 hour  Answer Assessment - Initial Assessment Questions Chief Complaint: Pain in LUQ abdomen for 2 days  Symptoms: 8/10 pain constant, diarrhea for about 3 days but now more solid now  Pertinent Negatives: Patient denies radiation, fever urination pain, vomiting  Protocols used: Abdominal Pain - [facility]

## 2023-09-07 NOTE — Addendum Note (Signed)
 Addended by: Marypat Kimmet M on: 09/07/2023 03:16 PM   Modules accepted: Orders

## 2023-11-17 ENCOUNTER — Encounter: Payer: Self-pay | Admitting: Family Medicine

## 2023-11-23 ENCOUNTER — Encounter: Payer: Self-pay | Admitting: Family Medicine

## 2023-11-23 ENCOUNTER — Ambulatory Visit: Admitting: Family Medicine

## 2023-11-23 VITALS — BP 119/69 | HR 69 | Ht 66.0 in | Wt 232.3 lb

## 2023-11-23 DIAGNOSIS — E1169 Type 2 diabetes mellitus with other specified complication: Secondary | ICD-10-CM | POA: Diagnosis not present

## 2023-11-23 DIAGNOSIS — E1159 Type 2 diabetes mellitus with other circulatory complications: Secondary | ICD-10-CM | POA: Diagnosis not present

## 2023-11-23 DIAGNOSIS — E785 Hyperlipidemia, unspecified: Secondary | ICD-10-CM | POA: Diagnosis not present

## 2023-11-23 DIAGNOSIS — Z0000 Encounter for general adult medical examination without abnormal findings: Secondary | ICD-10-CM

## 2023-11-23 DIAGNOSIS — I152 Hypertension secondary to endocrine disorders: Secondary | ICD-10-CM

## 2023-11-23 DIAGNOSIS — Z0001 Encounter for general adult medical examination with abnormal findings: Secondary | ICD-10-CM

## 2023-11-23 DIAGNOSIS — Z1231 Encounter for screening mammogram for malignant neoplasm of breast: Secondary | ICD-10-CM

## 2023-11-23 MED ORDER — TIRZEPATIDE 12.5 MG/0.5ML ~~LOC~~ SOAJ
12.5000 mg | SUBCUTANEOUS | 1 refills | Status: DC
Start: 2023-11-23 — End: 2024-02-22

## 2023-11-23 NOTE — Assessment & Plan Note (Signed)
 Type 2 diabetes mellitus, well-managed with Mounjaro . Discussed regular monitoring and potential medication adjustment due to weight loss challenges. - Increase Mounjaro  dose to 12.5 mg weekly - Order A1c test - Perform urine test for kidney damage - Ensure eye exam is scheduled for later this month

## 2023-11-23 NOTE — Assessment & Plan Note (Signed)
 Hypertension is well-controlled with current management. Blood pressure reading is 119/60 mmHg.

## 2023-11-23 NOTE — Progress Notes (Signed)
 Complete physical exam   [name]   DOB: [birth year]   58 y.o. Female  MRN: [id number] Visit Date: 11/23/2023  Today's healthcare provider: Jon Eva, MD   Chief Complaint  Patient presents with   Annual Exam    Last completed- 11/15/2022 Diet -  General, well balanced Exercise - none Feeling - physically in pain but mentally well Sleeping - poorly  Concerns -  none   Care Management    Hepatits B vaccine - unsure Pneumococcal Vaccine - declined   Subjective    Heather Orr is a 58 y.o. female who presents today for a complete physical exam.    Discussed the use of AI scribe software for clinical note transcription with the patient, who gave verbal consent to proceed.  History of Present Illness   Heather Orr is a 58 year old female who presents for an annual physical exam.  She takes atorvastatin , citalopram , Mounjaro , and trazodone . She is frustrated with her weight loss progress despite Mounjaro . She has type 2 diabetes but is not on medication and understands the need for regular monitoring, including annual urine tests for kidney damage, which have been normal. She received the shingles vaccine and is considering the pneumonia vaccine. She is not due for a tetanus booster until next year.  She is confused about the scheduling of a follow-up lung scan, which was due in May. She recalls having scans in November and three months later but is unsure of the next steps. She is due for her annual eye exam and mammogram this month. Her last colonoscopy was in 2023, with a follow-up in 2028.  She is frustrated with insurance constraints limiting discussion of issues outside the wellness visit, particularly regarding pain experienced for about three weeks.        Last depression screening scores    11/23/2023    8:57 AM 09/06/2023    9:47 AM 05/22/2023    8:18 AM  PHQ 2/9 Scores  PHQ - 2 Score 0 0 0  PHQ- 9 Score 8 8 7    Last fall risk  screening    11/23/2023    8:57 AM  Fall Risk   Falls in the past year? 1  Number falls in past yr: 0  Injury with Fall? 1  Risk for fall due to : History of fall(s)  Follow up Falls evaluation completed        Medications: Outpatient Medications Prior to Visit  Medication Sig   atorvastatin  (LIPITOR) 20 MG tablet Take 1 tablet (20 mg total) by mouth daily.   Cholecalciferol (VITAMIN D3) 125 MCG (5000 UT) CAPS Take 1 capsule by mouth daily.   citalopram  (CELEXA ) 40 MG tablet TAKE 1 TABLET BY MOUTH EVERY DAY   fluticasone  (FLONASE ) 50 MCG/ACT nasal spray Place 2 sprays into both nostrils daily. (Patient taking differently: Place 2 sprays into both nostrils daily as needed.)   Omega-3 Fatty Acids (FISH OIL) 1000 MG CAPS Take 1 capsule by mouth daily.   omeprazole  (PRILOSEC) 20 MG capsule Take 20 mg by mouth daily.   traZODone  (DESYREL ) 150 MG tablet TAKE 1 TABLET (150 MG TOTAL) BY MOUTH AT BEDTIME. (Patient taking differently: Take 150 mg by mouth at bedtime as needed.)   [DISCONTINUED] tirzepatide  (MOUNJARO ) 10 MG/0.5ML Pen Inject 10 mg into the skin once a week.   [DISCONTINUED] saccharomyces boulardii (FLORASTOR) 250 MG capsule Take 1 capsule (250 mg total) by mouth daily. (Patient not  taking: Reported on 11/23/2023)   No facility-administered medications prior to visit.    Review of Systems    Objective    BP 119/69 (BP Location: Left Arm, Patient Position: Sitting, Cuff Size: Large)   Pulse 69   Ht 5' 6 (1.676 m)   Wt 232 lb 4.8 oz (105.4 kg)   SpO2 100%   BMI 37.49 kg/m    Physical Exam Vitals reviewed.  Constitutional:      General: She is not in acute distress.    Appearance: Normal appearance. She is well-developed. She is not diaphoretic.  HENT:     Head: Normocephalic and atraumatic.     Right Ear: Tympanic membrane, ear canal and external ear normal.     Left Ear: Tympanic membrane, ear canal and external ear normal.     Nose: Nose normal.      Mouth/Throat:     Mouth: Mucous membranes are moist.     Pharynx: Oropharynx is clear. No oropharyngeal exudate.  Eyes:     General: No scleral icterus.    Conjunctiva/sclera: Conjunctivae normal.     Pupils: Pupils are equal, round, and reactive to light.  Neck:     Thyroid: No thyromegaly.  Cardiovascular:     Rate and Rhythm: Normal rate and regular rhythm.     Heart sounds: Normal heart sounds. No murmur heard. Pulmonary:     Effort: Pulmonary effort is normal. No respiratory distress.     Breath sounds: Normal breath sounds. No wheezing or rales.  Abdominal:     General: There is no distension.     Palpations: Abdomen is soft.     Tenderness: There is no abdominal tenderness.  Musculoskeletal:        General: No deformity.     Cervical back: Neck supple.     Right lower leg: No edema.     Left lower leg: No edema.  Lymphadenopathy:     Cervical: No cervical adenopathy.  Skin:    General: Skin is warm and dry.     Findings: No rash.  Neurological:     Mental Status: She is alert and oriented to person, place, and time. Mental status is at baseline.     Gait: Gait normal.  Psychiatric:        Mood and Affect: Mood normal.        Behavior: Behavior normal.        Thought Content: Thought content normal.      No results found for any visits on 11/23/23.  Assessment & Plan    Routine Health Maintenance and Physical Exam  Exercise Activities and Dietary recommendations  Goals   None     Immunization History  Administered Date(s) Administered   Influenza-Unspecified 03/07/2017, 03/06/2018   Janssen (J&J) SARS-COV-2 Vaccination 10/23/2019   Td 12/30/2004   Tdap 05/20/2015   Zoster Recombinant(Shingrix ) 09/09/2019, 02/26/2020    Health Maintenance  Topic Date Due   Pneumococcal Vaccine 6-81 Years old (1 of 2 - PCV) Never done   Hepatitis B Vaccines (1 of 3 - 19+ 3-dose series) Never done   COVID-19 Vaccine (2 - 2024-25 season) 01/22/2023   Diabetic kidney  evaluation - Urine ACR  11/15/2023   HEMOGLOBIN A1C  11/20/2023   MAMMOGRAM  12/12/2023   OPHTHALMOLOGY EXAM  12/15/2023   INFLUENZA VACCINE  12/22/2023   Lung Cancer Screening  03/29/2024   FOOT EXAM  05/21/2024   Diabetic kidney evaluation - eGFR measurement  09/05/2024  DTaP/Tdap/Td (3 - Td or Tdap) 05/19/2025   Colonoscopy  11/11/2026   Cervical Cancer Screening (HPV/Pap Cotest)  11/15/2027   Hepatitis C Screening  Completed   HIV Screening  Completed   Zoster Vaccines- Shingrix   Completed   HPV VACCINES  Aged Out   Meningococcal B Vaccine  Aged Out    Discussed health benefits of physical activity, and encouraged her to engage in regular exercise appropriate for her age and condition.  Problem List Items Addressed This Visit       Cardiovascular and Mediastinum   Hypertension associated with diabetes (HCC)   Hypertension is well-controlled with current management. Blood pressure reading is 119/60 mmHg.      Relevant Medications   tirzepatide  (MOUNJARO ) 12.5 MG/0.5ML Pen   Other Relevant Orders   Urine microalbumin-creatinine with uACR     Endocrine   Hyperlipidemia associated with type 2 diabetes mellitus (HCC)   Hyperlipidemia managed with atorvastatin . Routine cholesterol monitoring planned. - Order cholesterol test       Relevant Medications   tirzepatide  (MOUNJARO ) 12.5 MG/0.5ML Pen   Other Relevant Orders   Comprehensive Metabolic Panel (CMET)   Lipid panel   T2DM (type 2 diabetes mellitus) (HCC)   Type 2 diabetes mellitus, well-managed with Mounjaro . Discussed regular monitoring and potential medication adjustment due to weight loss challenges. - Increase Mounjaro  dose to 12.5 mg weekly - Order A1c test - Perform urine test for kidney damage - Ensure eye exam is scheduled for later this month      Relevant Medications   tirzepatide  (MOUNJARO ) 12.5 MG/0.5ML Pen   Other Relevant Orders   Comprehensive Metabolic Panel (CMET)   HgB A1c     Other    Morbid obesity (HCC)   Morbid obesity with weight loss challenges despite Mounjaro  treatment. Discussed increasing Mounjaro  dose to improve weight loss outcomes. - Increase Mounjaro  dose to 12.5 mg weekly      Relevant Medications   tirzepatide  (MOUNJARO ) 12.5 MG/0.5ML Pen   Other Relevant Orders   Comprehensive Metabolic Panel (CMET)   Other Visit Diagnoses       Encounter for annual physical exam    -  Primary     Breast cancer screening by mammogram       Relevant Orders   MM 3D SCREENING MAMMOGRAM BILATERAL BREAST          Adult Wellness Visit Routine adult wellness visit with well-controlled blood pressure at 119/60 mmHg. Discussed preventive care, including vaccinations and screenings. Pneumonia vaccine recommended due to age and diabetes risk factors. - Administer pneumonia vaccine - Schedule mammogram - Order labs including cholesterol, kidney and liver function, and A1c - Perform urine test for diabetes-related kidney damage - Schedule six-month follow-up       Return in about 6 months (around 05/25/2024) for chronic disease f/u.     Jon Eva, MD  [facility] [phone] (phone) [phone] (fax)  [facility]

## 2023-11-23 NOTE — Assessment & Plan Note (Signed)
 Hyperlipidemia managed with atorvastatin . Routine cholesterol monitoring planned. - Order cholesterol test

## 2023-11-23 NOTE — Assessment & Plan Note (Signed)
 Morbid obesity with weight loss challenges despite Mounjaro  treatment. Discussed increasing Mounjaro  dose to improve weight loss outcomes. - Increase Mounjaro  dose to 12.5 mg weekly

## 2023-11-25 LAB — MICROALBUMIN / CREATININE URINE RATIO
Creatinine, Urine: 124.4 mg/dL
Microalb/Creat Ratio: 5 mg/g{creat} (ref 0–29)
Microalbumin, Urine: 5.7 ug/mL

## 2023-11-25 LAB — COMPREHENSIVE METABOLIC PANEL WITH GFR
ALT: 44 IU/L — ABNORMAL HIGH (ref 0–32)
AST: 27 IU/L (ref 0–40)
Albumin: 4.4 g/dL (ref 3.8–4.9)
Alkaline Phosphatase: 111 IU/L (ref 44–121)
BUN/Creatinine Ratio: 20 (ref 9–23)
BUN: 13 mg/dL (ref 6–24)
Bilirubin Total: 0.6 mg/dL (ref 0.0–1.2)
CO2: 20 mmol/L (ref 20–29)
Calcium: 9.8 mg/dL (ref 8.7–10.2)
Chloride: 104 mmol/L (ref 96–106)
Creatinine, Ser: 0.66 mg/dL (ref 0.57–1.00)
Globulin, Total: 2.5 g/dL (ref 1.5–4.5)
Glucose: 85 mg/dL (ref 70–99)
Potassium: 4.7 mmol/L (ref 3.5–5.2)
Sodium: 142 mmol/L (ref 134–144)
Total Protein: 6.9 g/dL (ref 6.0–8.5)
eGFR: 102 mL/min/1.73 (ref >59)

## 2023-11-25 LAB — HEMOGLOBIN A1C
Est. average glucose Bld gHb Est-mCnc: 103 mg/dL
Hgb A1c MFr Bld: 5.2 % (ref 4.8–5.6)

## 2023-11-25 LAB — LIPID PANEL
Chol/HDL Ratio: 2.8 ratio (ref 0.0–4.4)
Cholesterol, Total: 149 mg/dL (ref 100–199)
HDL: 53 mg/dL (ref >39)
LDL Chol Calc (NIH): 73 mg/dL (ref 0–99)
Triglycerides: 130 mg/dL (ref 0–149)
VLDL Cholesterol Cal: 23 mg/dL (ref 5–40)

## 2023-12-05 ENCOUNTER — Ambulatory Visit: Payer: Self-pay | Admitting: Family Medicine

## 2023-12-07 ENCOUNTER — Ambulatory Visit
Admission: RE | Admit: 2023-12-07 | Discharge: 2023-12-07 | Disposition: A | Discharge status: Home / Self Care | Source: Ambulatory Visit | Attending: Acute Care | Admitting: Acute Care

## 2023-12-07 DIAGNOSIS — Z87891 Personal history of nicotine dependence: Secondary | ICD-10-CM | POA: Diagnosis not present

## 2023-12-07 DIAGNOSIS — R911 Solitary pulmonary nodule: Secondary | ICD-10-CM

## 2023-12-07 DIAGNOSIS — R918 Other nonspecific abnormal finding of lung field: Secondary | ICD-10-CM | POA: Diagnosis not present

## 2023-12-14 ENCOUNTER — Telehealth: Payer: Self-pay

## 2023-12-14 DIAGNOSIS — Z1231 Encounter for screening mammogram for malignant neoplasm of breast: Secondary | ICD-10-CM | POA: Diagnosis not present

## 2023-12-14 LAB — HM MAMMOGRAPHY

## 2023-12-14 NOTE — Telephone Encounter (Unsigned)
 Copied from CRM [id number]. Topic: Clinical - Lab/Test Results >> Dec 14, 2023  4:26 PM Marissa P wrote: Reason for CRM: Patient is calling in for the update on the results for the lung screening, prefer call but mychart is fine

## 2023-12-15 ENCOUNTER — Encounter: Payer: Self-pay | Admitting: Family Medicine

## 2023-12-15 NOTE — Telephone Encounter (Signed)
 Advised

## 2023-12-15 NOTE — Telephone Encounter (Signed)
 Has not been read by radiology yet.  Lauraine Lites NP with pulmonary is the ordering provider. She will get the result after radiology reads it and reach out to the patient.  FYI for future messages of this type - you can check to see if radiology has commented yet and let them know that the ordering provider will reach out with results, not our office.

## 2023-12-18 DIAGNOSIS — E119 Type 2 diabetes mellitus without complications: Secondary | ICD-10-CM | POA: Diagnosis not present

## 2023-12-18 LAB — OPHTHALMOLOGY REPORT-SCANNED

## 2023-12-21 ENCOUNTER — Telehealth: Payer: Self-pay

## 2023-12-21 DIAGNOSIS — R911 Solitary pulmonary nodule: Secondary | ICD-10-CM

## 2023-12-21 NOTE — Telephone Encounter (Signed)
 Called and left VM for pt. Nodule growth from 9.40mm to 9.58mm in one year.

## 2023-12-21 NOTE — Telephone Encounter (Signed)
 Results reviewed by Ruthell, NP. Please review results below. Advise patient that due to nodule growth she will need to complete a 6 month f/u scan due 06/09/2024. Please place annual CT order, send results and plan to PCP.   IMPRESSION: 1. Lung-RADS 2, benign appearance or behavior. Continue annual screening with low-dose chest CT without contrast in 12 months. 2. Two-vessel coronary atherosclerosis. 3. Aortic Atherosclerosis (ICD10-I70.0) and Emphysema (ICD10-J43.9).

## 2023-12-22 NOTE — Telephone Encounter (Signed)
 Patient returned call. Informed her of the results of the LDCT. Advised pt that Heather Orr is more comfortable with a 6 month follow up scan due to the nodule growth. Patient is appreciative and in agreement with the sooner follow up. Scheduled LDCT for 06/10/2024. Pt verbalized understanding and denied any further questions or concerns at this time.

## 2023-12-22 NOTE — Addendum Note (Signed)
 Addended by: RHETT KELLY POUR on: 12/22/2023 09:43 AM   Modules accepted: Orders

## 2024-02-13 DIAGNOSIS — M1712 Unilateral primary osteoarthritis, left knee: Secondary | ICD-10-CM | POA: Diagnosis not present

## 2024-02-13 DIAGNOSIS — M25462 Effusion, left knee: Secondary | ICD-10-CM | POA: Diagnosis not present

## 2024-02-21 ENCOUNTER — Telehealth: Payer: Self-pay | Admitting: Family Medicine

## 2024-02-21 ENCOUNTER — Ambulatory Visit: Payer: Self-pay

## 2024-02-21 NOTE — Telephone Encounter (Signed)
 FYI Only or Action Required?: FYI only for provider.  Patient was last seen in primary care on 11/23/2023 by Myrla Jon HERO, MD.  Called Nurse Triage reporting right shoulder.  Symptoms began several months ago.  Interventions attempted: Prescription medications: mobic .  Symptoms are: unchanged.  Triage Disposition: See PCP When Office is Open (Within 3 Days)  Patient/caregiver understands and will follow disposition?: Yes     Copied from CRM #[id number]. Topic: Clinical - Red Word Triage >> Feb 21, 2024 10:55 AM [name] wrote: [facility] that prompted transfer to Nurse Triage: pain all over    ----------------------------------------------------------------------- From previous Reason for Contact - Scheduling: Patient/patient representative is calling to schedule an appointment. Refer to attachments for appointment information. Reason for Disposition  [1] MODERATE pain (e.g., interferes with normal activities) AND [2] present > 3 days  Answer Assessment - Initial Assessment Questions 1. ONSET: When did the pain start?     Since about April 2. LOCATION: Where is the pain located?     Right shoulder 3. PAIN: How bad is the pain? (Scale 1-10; or mild, moderate, severe)     mild 4. WORK OR EXERCISE: Has there been any recent work or exercise that involved this part of the body?     denies 5. CAUSE: What do you think is causing the shoulder pain?     no 6. OTHER SYMPTOMS: Do you have any other symptoms? (e.g., neck pain, swelling, rash, fever, numbness, weakness)     Neck pain on left side, right hip, left knee,  7. PREGNANCY: Is there any chance you are pregnant? When was your last menstrual period?     na  Protocols used: Shoulder Pain-A-AH

## 2024-02-21 NOTE — Telephone Encounter (Signed)
 Copied from CRM [id number]. Topic: General - Billing Inquiry >> Feb 21, 2024 10:57 AM [name] wrote: Reason for CRM: Patient states that she had a ling scan completed on Dec 29, 2023 and it was coded incorrectly. She is requesting a callback at [phone] ext. [phone].

## 2024-02-22 ENCOUNTER — Encounter: Payer: Self-pay | Admitting: Family Medicine

## 2024-02-22 ENCOUNTER — Ambulatory Visit: Admitting: Family Medicine

## 2024-02-22 VITALS — BP 144/76 | HR 74 | Wt 243.6 lb

## 2024-02-22 DIAGNOSIS — E1169 Type 2 diabetes mellitus with other specified complication: Secondary | ICD-10-CM

## 2024-02-22 DIAGNOSIS — Z23 Encounter for immunization: Secondary | ICD-10-CM | POA: Diagnosis not present

## 2024-02-22 DIAGNOSIS — M62838 Other muscle spasm: Secondary | ICD-10-CM

## 2024-02-22 DIAGNOSIS — H9313 Tinnitus, bilateral: Secondary | ICD-10-CM

## 2024-02-22 DIAGNOSIS — F4323 Adjustment disorder with mixed anxiety and depressed mood: Secondary | ICD-10-CM

## 2024-02-22 DIAGNOSIS — M5416 Radiculopathy, lumbar region: Secondary | ICD-10-CM | POA: Diagnosis not present

## 2024-02-22 DIAGNOSIS — G894 Chronic pain syndrome: Secondary | ICD-10-CM

## 2024-02-22 DIAGNOSIS — Z7985 Long-term (current) use of injectable non-insulin antidiabetic drugs: Secondary | ICD-10-CM

## 2024-02-22 MED ORDER — DULOXETINE HCL 20 MG PO CPEP: 20.0000 mg | ORAL_CAPSULE | Freq: Every day | ORAL | 1 refills | Status: DC

## 2024-02-22 MED ORDER — METAXALONE 800 MG PO TABS: 800.0000 mg | ORAL_TABLET | Freq: Three times a day (TID) | ORAL | 1 refills | Status: AC | PRN

## 2024-02-22 NOTE — Telephone Encounter (Signed)
 Patient advised. Verbalized understanding

## 2024-02-22 NOTE — Progress Notes (Signed)
 Acute visit   [name]   DOB: 02-Sep-1965   58 y.o. Female  MRN: [id number] PCP: Myrla Jon HERO, MD   Chief Complaint  Patient presents with   Acute Visit   Pain    Pain located in shoulder, neck, knee, hip, biceps X years. Reports off and on. Shoulder pain is the main one she is present for today and it has been giving concerns since April. Reports this is like the second major flare with this right shoulder pain.    Tinnitus    Reports seems to be getting worse and would like to discuss   Subjective    Discussed the use of AI scribe software for clinical note transcription with the patient, who gave verbal consent to proceed.  History of Present Illness   Heather Orr is a 58 year old female who presents with right shoulder pain and tinnitus.  She experiences significant sharp pain in her right shoulder, radiating down her arm to the elbow. The pain worsens when lying down, especially on her stomach, and when her arm is elevated, such as during computer use or driving. The pain has been persistent for several months. There is no numbness or tingling down the arm, and she does not recall any specific injury to the shoulder.  Bilateral bicep pain affects her ability to lift objects, with a noted decrease in strength. This weakness is symmetric in both arms.  She has longstanding tinnitus, which is worsening and becoming louder. It affects her sleep, requiring her to use a fan to mask the noise, though this is becoming less effective.         11/23/2023    8:57 AM 09/06/2023    9:47 AM 05/22/2023    8:18 AM 01/26/2023    3:52 PM  GAD 7 : Generalized Anxiety Score  Nervous, Anxious, on Edge 0 0 0 1  Control/stop worrying 0 1 0 0  Worry too much - different things 0 1 0 0  Trouble relaxing 0 1 1 0  Restless 0 0 1 0  Easily annoyed or irritable 3 3 2 1   Afraid - awful might happen 0 0 0 0  Total GAD 7 Score 3 6 4 2   Anxiety Difficulty Somewhat  difficult Not difficult at all Not difficult at all Not difficult at all       11/23/2023    8:57 AM 09/06/2023    9:47 AM 05/22/2023    8:18 AM 01/26/2023    3:52 PM 06/10/2022    8:06 AM  Depression screen PHQ 2/9  Decreased Interest 0 0 0 0 0  Down, Depressed, Hopeless 0 0 0 0 0  PHQ - 2 Score 0 0 0 0 0  Altered sleeping 3 3 2 2 2   Tired, decreased energy 3 3 2 2 3   Change in appetite 2 1 3  0 0  Feeling bad or failure about yourself  0 0 0 0 0  Trouble concentrating 0 1 0 0 0  Moving slowly or fidgety/restless 0 0 0 0 0  Suicidal thoughts 0 0 0 0 0  PHQ-9 Score 8 8 7 4 5   Difficult doing work/chores Not difficult at all Not difficult at all Not difficult at all Not difficult at all     Review of Systems   Objective    BP (!) 144/76 (BP Location: Left Arm, Patient Position: Sitting, Cuff Size: Normal)   Pulse 74  Wt 243 lb 9.6 oz (110.5 kg)   SpO2 100%   BMI 39.32 kg/m   Physical Exam Vitals reviewed.  Constitutional:      General: She is not in acute distress.    Appearance: Normal appearance. She is well-developed. She is not diaphoretic.  HENT:     Head: Normocephalic and atraumatic.  Eyes:     General: No scleral icterus.    Conjunctiva/sclera: Conjunctivae normal.  Neck:     Thyroid: No thyromegaly.  Cardiovascular:     Rate and Rhythm: Normal rate and regular rhythm.     Heart sounds: Normal heart sounds. No murmur heard. Pulmonary:     Effort: Pulmonary effort is normal. No respiratory distress.     Breath sounds: Normal breath sounds. No wheezing, rhonchi or rales.  Musculoskeletal:     Cervical back: Neck supple.     Right lower leg: No edema.     Left lower leg: No edema.     Comments: TTP over R trapeziu muscle, tightness of b/l traps R Shoulder: Inspection reveals no abnormalities, atrophy or asymmetry. Palpation is normal with no tenderness over AC joint or bicipital groove. ROM is full in all planes. Rotator cuff strength normal  throughout. No signs of impingement with negative Neer and Hawkin's tests, empty can. Speeds and Yergason's tests normal. No labral pathology noted with negative Obrien's, negative clunk and good stability. Normal scapular function observed. No painful arc and no drop arm sign. No apprehension sign   Lymphadenopathy:     Cervical: No cervical adenopathy.  Skin:    General: Skin is warm and dry.     Findings: No rash.  Neurological:     Mental Status: She is alert and oriented to person, place, and time. Mental status is at baseline.  Psychiatric:        Mood and Affect: Mood normal.        Behavior: Behavior normal.       No results found for any visits on 02/22/24.  Assessment & Plan     Problem List Items Addressed This Visit       Endocrine   T2DM (type 2 diabetes mellitus) (HCC)     Nervous and Auditory   Lumbar radiculitis   Relevant Medications   metaxalone (SKELAXIN) 800 MG tablet   DULoxetine (CYMBALTA) 20 MG capsule     Other   Morbid obesity (HCC)   Other Visit Diagnoses       Trapezius muscle spasm    -  Primary     Tinnitus of both ears       Relevant Orders   Ambulatory referral to ENT     Immunization due       Relevant Orders   Pneumococcal conjugate vaccine 20-valent (Completed)     Chronic pain syndrome       Relevant Medications   meloxicam  (MOBIC ) 7.5 MG tablet   metaxalone (SKELAXIN) 800 MG tablet   DULoxetine (CYMBALTA) 20 MG capsule     Adjustment disorder with mixed anxiety and depressed mood               Right trapezius myalgia Chronic pain in the right trapezius muscle, exacerbated by certain positions and activities. Pain radiates from the shoulder to the neck and down the arm. No evidence of a pinched nerve. Likely due to muscle tightness and stress. - Prescribe Skelaxin for muscle relaxation, up to three times a day as needed. - Advise use of heat therapy,  such as a heating pad or hot shower, directed at the affected  area. - Provide home exercises to stretch and relax the trapezius muscle. - Encourage consideration of yoga, particularly chair yoga, for joint pain and muscle strengthening.  Chronic back pain with lumbosacral radiculopathy and numbness Chronic back pain with lumbosacral radiculopathy, causing numbness in the lower extremities. Symptoms include numbness while standing or walking, relieved by sitting. Likely related to back issues. - Advise follow-up with neurosurgeon Dr. Onetha for evaluation of new symptoms and management of chronic back pain.  Chronic left knee pain with meniscus injury and osteoarthritis Chronic left knee pain with meniscus injury and osteoarthritis. Recent exacerbation with severe pain, managed with Mobic . Swelling has decreased with Mobic  use.  Bilateral upper arm muscle weakness Bilateral upper arm muscle weakness, possibly due to decreased use and muscle atrophy. No evidence of tendinitis or significant structural issues. Strength is reduced compared to previous levels. - Recommend starting with light weight exercises, such as lifting a gallon of liquid, to gradually rebuild strength.  Tinnitus Chronic tinnitus with worsening symptoms. Previous ENT evaluation with hearing test but no MRI. No definitive cause identified. Symptoms include difficulty sleeping due to loud ringing. - Refer to ENT for further evaluation and possible MRI to assess for underlying causes of tinnitus.  Depression Depression, previously managed with Celexa , which was discontinued. Recent emotional distress possibly exacerbated by personal circumstances. Considering Cymbalta for management of depression and chronic pain. - Prescribe Cymbalta 20 mg daily for depression and chronic pain management.  Type 2 diabetes mellitus, diet controlled Type 2 diabetes mellitus, currently diet controlled. Recent cessation of Mounjaro  due to lack of efficacy for weight loss and well-controlled blood sugar levels.  Last A1c was 5.2. - Monitor blood sugar levels regularly. - Reassess A1c in January to evaluate diabetes control without medication.  - with HTN and HLD      Meds ordered this encounter  Medications   metaxalone (SKELAXIN) 800 MG tablet    Sig: Take 1 tablet (800 mg total) by mouth 3 (three) times daily as needed for muscle spasms.    Dispense:  60 tablet    Refill:  1   DULoxetine (CYMBALTA) 20 MG capsule    Sig: Take 1 capsule (20 mg total) by mouth daily.    Dispense:  30 capsule    Refill:  1     Return if symptoms worsen or fail to improve.      Jon Eva, MD  [facility] [phone] (phone) [phone] (fax)  [facility]

## 2024-02-22 NOTE — Telephone Encounter (Signed)
 Noted

## 2024-02-22 NOTE — Telephone Encounter (Signed)
 Wil lsend to ordering provider. They will need to look into coding of the scan as we didn't order this. Let patient know that office should call her back

## 2024-02-27 DIAGNOSIS — H6983 Other specified disorders of Eustachian tube, bilateral: Secondary | ICD-10-CM | POA: Diagnosis not present

## 2024-02-27 DIAGNOSIS — H903 Sensorineural hearing loss, bilateral: Secondary | ICD-10-CM | POA: Diagnosis not present

## 2024-02-27 DIAGNOSIS — H9313 Tinnitus, bilateral: Secondary | ICD-10-CM | POA: Diagnosis not present

## 2024-02-29 NOTE — Telephone Encounter (Addendum)
 This was a 3 month follow up to the LCS CT completed on 09/05/23.  Follow ups are billed under CPT code 28749.  This is not considered a preventative CT (LCS CT CPT code 28728) & pt's insurance will only cover the L[facility] CT once per year.

## 2024-03-08 NOTE — Telephone Encounter (Signed)
 LVM for pt.  Closing message per protocols.

## 2024-03-12 DIAGNOSIS — M47816 Spondylosis without myelopathy or radiculopathy, lumbar region: Secondary | ICD-10-CM | POA: Diagnosis not present

## 2024-03-12 DIAGNOSIS — G959 Disease of spinal cord, unspecified: Secondary | ICD-10-CM | POA: Diagnosis not present

## 2024-03-12 DIAGNOSIS — M542 Cervicalgia: Secondary | ICD-10-CM | POA: Diagnosis not present

## 2024-03-12 DIAGNOSIS — Z6839 Body mass index (BMI) 39.0-39.9, adult: Secondary | ICD-10-CM | POA: Diagnosis not present

## 2024-03-13 ENCOUNTER — Encounter: Payer: Self-pay | Admitting: Neurosurgery

## 2024-03-13 ENCOUNTER — Other Ambulatory Visit: Payer: Self-pay | Admitting: Neurosurgery

## 2024-03-13 DIAGNOSIS — G959 Disease of spinal cord, unspecified: Secondary | ICD-10-CM

## 2024-03-13 DIAGNOSIS — M47816 Spondylosis without myelopathy or radiculopathy, lumbar region: Secondary | ICD-10-CM

## 2024-03-18 ENCOUNTER — Ambulatory Visit
Admission: RE | Admit: 2024-03-18 | Discharge: 2024-03-18 | Disposition: A | Discharge status: Home / Self Care | Source: Ambulatory Visit | Attending: Neurosurgery | Admitting: Neurosurgery

## 2024-03-18 DIAGNOSIS — M47816 Spondylosis without myelopathy or radiculopathy, lumbar region: Secondary | ICD-10-CM

## 2024-03-18 DIAGNOSIS — G959 Disease of spinal cord, unspecified: Secondary | ICD-10-CM

## 2024-03-18 DIAGNOSIS — M5020 Other cervical disc displacement, unspecified cervical region: Secondary | ICD-10-CM | POA: Diagnosis not present

## 2024-03-18 DIAGNOSIS — M4727 Other spondylosis with radiculopathy, lumbosacral region: Secondary | ICD-10-CM | POA: Diagnosis not present

## 2024-03-18 DIAGNOSIS — M47812 Spondylosis without myelopathy or radiculopathy, cervical region: Secondary | ICD-10-CM | POA: Diagnosis not present

## 2024-03-18 DIAGNOSIS — M4802 Spinal stenosis, cervical region: Secondary | ICD-10-CM | POA: Diagnosis not present

## 2024-03-18 DIAGNOSIS — M5117 Intervertebral disc disorders with radiculopathy, lumbosacral region: Secondary | ICD-10-CM | POA: Diagnosis not present

## 2024-03-18 DIAGNOSIS — M48061 Spinal stenosis, lumbar region without neurogenic claudication: Secondary | ICD-10-CM | POA: Diagnosis not present

## 2024-03-25 DIAGNOSIS — M1712 Unilateral primary osteoarthritis, left knee: Secondary | ICD-10-CM | POA: Diagnosis not present

## 2024-03-28 DIAGNOSIS — M542 Cervicalgia: Secondary | ICD-10-CM | POA: Diagnosis not present

## 2024-03-28 DIAGNOSIS — M47816 Spondylosis without myelopathy or radiculopathy, lumbar region: Secondary | ICD-10-CM | POA: Diagnosis not present

## 2024-04-16 DIAGNOSIS — M47816 Spondylosis without myelopathy or radiculopathy, lumbar region: Secondary | ICD-10-CM | POA: Diagnosis not present

## 2024-04-27 ENCOUNTER — Other Ambulatory Visit: Payer: Self-pay | Admitting: Family Medicine

## 2024-04-30 DIAGNOSIS — M47816 Spondylosis without myelopathy or radiculopathy, lumbar region: Secondary | ICD-10-CM | POA: Diagnosis not present

## 2024-05-17 ENCOUNTER — Encounter: Admitting: Nurse Practitioner

## 2024-05-24 ENCOUNTER — Other Ambulatory Visit: Payer: Self-pay | Admitting: Medical Genetics

## 2024-05-25 ENCOUNTER — Other Ambulatory Visit
Admission: RE | Admit: 2024-05-25 | Discharge: 2024-05-25 | Disposition: A | Discharge status: Home / Self Care | Payer: Self-pay | Source: Ambulatory Visit | Attending: Family Medicine | Admitting: Family Medicine

## 2024-05-30 ENCOUNTER — Ambulatory Visit: Admitting: Family Medicine

## 2024-05-30 ENCOUNTER — Encounter: Payer: Self-pay | Admitting: Family Medicine

## 2024-05-30 VITALS — BP 131/67 | HR 77 | Resp 16 | Ht 66.0 in | Wt 256.0 lb

## 2024-05-30 DIAGNOSIS — F4323 Adjustment disorder with mixed anxiety and depressed mood: Secondary | ICD-10-CM | POA: Diagnosis not present

## 2024-05-30 DIAGNOSIS — E1159 Type 2 diabetes mellitus with other circulatory complications: Secondary | ICD-10-CM

## 2024-05-30 DIAGNOSIS — E1169 Type 2 diabetes mellitus with other specified complication: Secondary | ICD-10-CM | POA: Diagnosis not present

## 2024-05-30 DIAGNOSIS — I152 Hypertension secondary to endocrine disorders: Secondary | ICD-10-CM | POA: Diagnosis not present

## 2024-05-30 DIAGNOSIS — E785 Hyperlipidemia, unspecified: Secondary | ICD-10-CM

## 2024-05-30 DIAGNOSIS — Z6841 Body Mass Index (BMI) 40.0 and over, adult: Secondary | ICD-10-CM | POA: Diagnosis not present

## 2024-05-30 MED ORDER — DULOXETINE HCL 40 MG PO CPEP: 40.0000 mg | ORAL_CAPSULE | Freq: Every day | ORAL | 1 refills | Status: DC

## 2024-05-30 NOTE — Assessment & Plan Note (Signed)
 Discussed importance of healthy weight management Discussed diet and exercise

## 2024-05-30 NOTE — Assessment & Plan Note (Signed)
 Managed with Cymbalta  20 mg daily. Reports irritability and frustration, possibly related to situational stressors. Current dose may not be sufficient. - Increased Cymbalta  to 40 mg daily - Provided a three-month supply of Cymbalta 

## 2024-05-30 NOTE — Assessment & Plan Note (Signed)
 Diet-controlled with no current medication.

## 2024-05-30 NOTE — Assessment & Plan Note (Signed)
 Diet-controlled with recent blood glucose levels ranging from 99 to 200 mg/dL, averaging 854-849 mg/dL. Urine has a strong odor, possibly indicating elevated blood glucose. No urinary tract symptoms reported. Awaiting A1c results to assess current glycemic control. - Ordered A1c test - Encouraged hydration and exercise to manage blood glucose levels

## 2024-05-30 NOTE — Assessment & Plan Note (Signed)
 Managed with atorvastatin  20 mg daily. No recent lipid panel results discussed. - Continue atorvastatin  20 mg daily - Ordered lipid panel

## 2024-05-30 NOTE — Progress Notes (Signed)
 "     Established patient visit   [name]   DOB: 1965/06/01   59 y.o. Female  MRN: [id number] Visit Date: 05/30/2024  Today's healthcare provider: Jon Eva, MD   Chief Complaint  Patient presents with   Medical Management of Chronic Issues    6 month f/u  Would like to discuss increasing Cymbalta  dosage   Subjective    HPI HPI     Medical Management of Chronic Issues    Additional comments: 6 month f/u  Would like to discuss increasing Cymbalta  dosage      Last edited by Wilfred Hargis RAMAN, CMA on 05/30/2024  7:56 AM.       Discussed the use of AI scribe software for clinical note transcription with the patient, who gave verbal consent to proceed.  History of Present Illness   Heather Orr is a 60 year old female with hypertension, diabetes, hyperlipidemia, and moderate obesity who presents for a follow-up visit to discuss medication management and symptoms.  She manages diabetes and hypertension with diet only and is off medication. Home blood sugars range from 99 to 200 mg/dL with an average around 145 to 150 mg/dL. She notices strong-smelling urine when sugars are higher and feels shaky when her blood sugar is high. She drinks more water , exercises, and monitors glucose with finger sticks.  She takes atorvastatin  20 mg daily for hyperlipidemia and started Cymbalta  20 mg daily three months ago for chronic pain and depression. She has chronic back and knee pain and uses meloxicam . She reports persistent irritability and frustration and requests a three-month supply of Cymbalta .  She has dry, scaly skin on her feet and hands. She uses lotion but finds it hard to keep up due to frequent handwashing.  She is scheduled for nerve ablation for back pain after a successful numbing injection trial. She has had falls at home from tripping over dog toys, causing soreness and bruising but no serious injury.  She tracks her heart rate with a smartwatch and  finger device and has noted resting heart rates up to 165 to 170 bpm. She plans to follow up with cardiology later this year.          05/30/2024    8:38 AM 11/23/2023    8:57 AM 09/06/2023    9:47 AM 05/22/2023    8:18 AM  GAD 7 : Generalized Anxiety Score  Nervous, Anxious, on Edge 0 0 0 0  Control/stop worrying 1 0 1 0  Worry too much - different things 1 0 1 0  Trouble relaxing 1 0 1 1  Restless 0 0 0 1  Easily annoyed or irritable 3 3 3 2   Afraid - awful might happen 0 0 0 0  Total GAD 7 Score 6 3 6 4   Anxiety Difficulty Somewhat difficult Somewhat difficult Not difficult at all Not difficult at all       05/30/2024    8:37 AM 05/30/2024    7:57 AM 11/23/2023    8:57 AM 09/06/2023    9:47 AM 05/22/2023    8:18 AM  Depression screen PHQ 2/9  Decreased Interest 1 1 0 0 0  Down, Depressed, Hopeless 0 0 0 0 0  PHQ - 2 Score 1 1 0 0 0  Altered sleeping 3  3 3 2   Tired, decreased energy 3  3 3 2   Change in appetite 3  2 1 3   Feeling bad or failure about yourself  0  0 0 0  Trouble concentrating 1  0 1 0  Moving slowly or fidgety/restless 1  0 0 0  Suicidal thoughts 0  0 0 0  PHQ-9 Score 12  8  8  7    Difficult doing work/chores Somewhat difficult  Not difficult at all Not difficult at all Not difficult at all     Data saved with a previous flowsheet row definition     Medications: Show/hide medication list[1]  Review of Systems     Objective    BP 131/67   Pulse 77   Resp 16   Ht 5' 6 (1.676 m)   Wt 256 lb (116.1 kg)   SpO2 97%   BMI 41.32 kg/m    Physical Exam Vitals reviewed.  Constitutional:      General: She is not in acute distress.    Appearance: Normal appearance. She is well-developed. She is not diaphoretic.  HENT:     Head: Normocephalic and atraumatic.  Eyes:     General: No scleral icterus.    Conjunctiva/sclera: Conjunctivae normal.  Neck:     Thyroid: No thyromegaly.  Cardiovascular:     Rate and Rhythm: Normal rate and regular rhythm.      Heart sounds: Normal heart sounds. No murmur heard. Pulmonary:     Effort: Pulmonary effort is normal. No respiratory distress.     Breath sounds: Normal breath sounds. No wheezing, rhonchi or rales.  Musculoskeletal:     Cervical back: Neck supple.     Right lower leg: No edema.     Left lower leg: No edema.  Lymphadenopathy:     Cervical: No cervical adenopathy.  Skin:    General: Skin is warm and dry.     Findings: No rash.  Neurological:     Mental Status: She is alert and oriented to person, place, and time. Mental status is at baseline.  Psychiatric:        Mood and Affect: Mood normal.        Behavior: Behavior normal.      No results found for any visits on 05/30/24.  Assessment & Plan     Problem List Items Addressed This Visit       Cardiovascular and Mediastinum   Hypertension associated with diabetes (HCC)   Diet-controlled with no current medication.      Relevant Orders   Comprehensive metabolic panel with GFR     Endocrine   Hyperlipidemia associated with type 2 diabetes mellitus (HCC)   Managed with atorvastatin  20 mg daily. No recent lipid panel results discussed. - Continue atorvastatin  20 mg daily - Ordered lipid panel      Relevant Orders   Comprehensive metabolic panel with GFR   Lipid Panel With LDL/HDL Ratio   T2DM (type 2 diabetes mellitus) (HCC) - Primary   Diet-controlled with recent blood glucose levels ranging from 99 to 200 mg/dL, averaging 854-849 mg/dL. Urine has a strong odor, possibly indicating elevated blood glucose. No urinary tract symptoms reported. Awaiting A1c results to assess current glycemic control. - Ordered A1c test - Encouraged hydration and exercise to manage blood glucose levels      Relevant Orders   Hemoglobin A1c     Other   Morbid obesity (HCC)   Discussed importance of healthy weight management Discussed diet and exercise       Adjustment disorder with mixed anxiety and depressed mood   Managed  with Cymbalta  20 mg daily. Reports irritability and frustration,  possibly related to situational stressors. Current dose may not be sufficient. - Increased Cymbalta  to 40 mg daily - Provided a three-month supply of Cymbalta            Chronic pain Managed with meloxicam  and Cymbalta . Scheduled for nerve ablation next week following a successful temporary numbing procedure. - Proceed with scheduled nerve ablation  General health maintenance Routine health maintenance discussed, including foot exam and skin care. - Performed foot exam - Recommended Neutrogena hand cream for dry skin        Return in about 6 months (around 11/27/2024) for CPE.       Jon Eva, MD  [facility] [phone] (phone) [phone] (fax)  [facility]      [1]  Outpatient Medications Prior to Visit  Medication Sig   atorvastatin  (LIPITOR) 20 MG tablet Take 1 tablet (20 mg total) by mouth daily.   Cholecalciferol (VITAMIN D3) 125 MCG (5000 UT) CAPS Take 1 capsule by mouth daily.   fluticasone  (FLONASE ) 50 MCG/ACT nasal spray Place 2 sprays into both nostrils daily. (Patient taking differently: Place 2 sprays into both nostrils daily as needed.)   meloxicam  (MOBIC ) 7.5 MG tablet Take 7.5 mg by mouth 2 (two) times daily.   metaxalone  (SKELAXIN ) 800 MG tablet Take 1 tablet (800 mg total) by mouth 3 (three) times daily as needed for muscle spasms.   Omega-3 Fatty Acids (FISH OIL) 1000 MG CAPS Take 1 capsule by mouth daily.   omeprazole  (PRILOSEC) 20 MG capsule Take 20 mg by mouth daily.   traZODone  (DESYREL ) 150 MG tablet TAKE 1 TABLET (150 MG TOTAL) BY MOUTH AT BEDTIME. (Patient taking differently: Take 150 mg by mouth at bedtime as needed.)   [DISCONTINUED] DULoxetine  (CYMBALTA ) 20 MG capsule TAKE ONE CAPSULE BY MOUTH ONE TIME DAILY   No facility-administered medications prior to visit.   "

## 2024-05-31 LAB — COMPREHENSIVE METABOLIC PANEL WITH GFR
ALT: 66 IU/L — ABNORMAL HIGH (ref 0–32)
AST: 43 IU/L — ABNORMAL HIGH (ref 0–40)
Albumin: 4.4 g/dL (ref 3.8–4.9)
Alkaline Phosphatase: 116 IU/L (ref 49–135)
BUN/Creatinine Ratio: 16 (ref 9–23)
BUN: 9 mg/dL (ref 6–24)
Bilirubin Total: 0.6 mg/dL (ref 0.0–1.2)
CO2: 24 mmol/L (ref 20–29)
Calcium: 9.9 mg/dL (ref 8.7–10.2)
Chloride: 102 mmol/L (ref 96–106)
Creatinine, Ser: 0.58 mg/dL (ref 0.57–1.00)
Globulin, Total: 2.4 g/dL (ref 1.5–4.5)
Glucose: 170 mg/dL — ABNORMAL HIGH (ref 70–99)
Potassium: 4.4 mmol/L (ref 3.5–5.2)
Sodium: 140 mmol/L (ref 134–144)
Total Protein: 6.8 g/dL (ref 6.0–8.5)
eGFR: 104 mL/min/1.73

## 2024-05-31 LAB — LIPID PANEL WITH LDL/HDL RATIO
Cholesterol, Total: 151 mg/dL (ref 100–199)
HDL: 47 mg/dL
LDL Chol Calc (NIH): 82 mg/dL (ref 0–99)
LDL/HDL Ratio: 1.7 ratio (ref 0.0–3.2)
Triglycerides: 124 mg/dL (ref 0–149)
VLDL Cholesterol Cal: 22 mg/dL (ref 5–40)

## 2024-05-31 LAB — HEMOGLOBIN A1C
Est. average glucose Bld gHb Est-mCnc: 160 mg/dL
Hgb A1c MFr Bld: 7.2 % — ABNORMAL HIGH (ref 4.8–5.6)

## 2024-06-04 ENCOUNTER — Ambulatory Visit: Payer: Self-pay | Admitting: Family Medicine

## 2024-06-05 LAB — GENECONNECT MOLECULAR SCREEN: Genetic Analysis Overall Interpretation: NEGATIVE

## 2024-06-10 ENCOUNTER — Other Ambulatory Visit (HOSPITAL_COMMUNITY): Payer: Self-pay

## 2024-06-10 ENCOUNTER — Encounter

## 2024-06-10 ENCOUNTER — Telehealth: Payer: Self-pay | Admitting: Family Medicine

## 2024-06-10 DIAGNOSIS — F4323 Adjustment disorder with mixed anxiety and depressed mood: Secondary | ICD-10-CM

## 2024-06-10 NOTE — Telephone Encounter (Signed)
 Pharmacy Patient Advocate Encounter   Received notification from Physician's Office that prior authorization for DULOXETINE  is required/requested.   Insurance verification completed.   The patient is insured through [facility].   Per test claim: Per test claim, medication is not covered due to plan/benefit exclusion, PA not submitted at this time

## 2024-06-10 NOTE — Telephone Encounter (Signed)
 Sounds like they will cover 20mg , so can we send in take 2 20 mg tabs daily (40mg  total) and they will cover that?

## 2024-06-10 NOTE — Telephone Encounter (Signed)
 Copied from CRM [id number]. Topic: Clinical - Prescription Issue >> Jun 10, 2024 10:08 AM Alfonso HERO wrote: Reason for CRM: pharmacy called stating the insurance will not cover the DULoxetine  40 MG CPEP but they will cover the 20 MG. They want to know if they should proceed with the 20 MG or will the office be submitting a PA.[email or web address]

## 2024-06-11 ENCOUNTER — Ambulatory Visit
Admission: RE | Admit: 2024-06-11 | Discharge: 2024-06-11 | Disposition: A | Source: Ambulatory Visit | Attending: Acute Care | Admitting: Acute Care

## 2024-06-11 DIAGNOSIS — R911 Solitary pulmonary nodule: Secondary | ICD-10-CM

## 2024-06-11 MED ORDER — DULOXETINE HCL 20 MG PO CPEP: 40.0000 mg | ORAL_CAPSULE | Freq: Every day | ORAL | 3 refills | Status: AC

## 2024-06-17 ENCOUNTER — Telehealth: Payer: Self-pay

## 2024-06-17 DIAGNOSIS — Z122 Encounter for screening for malignant neoplasm of respiratory organs: Secondary | ICD-10-CM

## 2024-06-17 DIAGNOSIS — Z87891 Personal history of nicotine dependence: Secondary | ICD-10-CM

## 2024-06-17 NOTE — Telephone Encounter (Signed)
 IMPRESSION: 1. Lung-RADS 2, benign appearance or behavior. Continue annual screening with low-dose chest CT without contrast in 12 months. 2. Mildly lobulated appearance of the pancreatic tail. Recommend further evaluation with contrast-enhanced MRI or pancreas protocol CT of the abdomen. 3. Cholelithiasis. 4. Aortic Atherosclerosis (ICD10-I70.0). Coronary artery calcifications. Assessment for potential risk factor modification, dietary therapy or pharmacologic therapy may be warranted, if clinically indicated.

## 2024-06-18 ENCOUNTER — Other Ambulatory Visit: Payer: Self-pay | Admitting: Family Medicine

## 2024-06-18 DIAGNOSIS — K869 Disease of pancreas, unspecified: Secondary | ICD-10-CM

## 2024-06-18 NOTE — Telephone Encounter (Signed)
 Let patient know that I have ordered MR Abdomen to f/u on the pancreas abnormality seen on CT chest in greater detail. [facility] should call to schedule

## 2024-06-18 NOTE — Addendum Note (Signed)
 Addended by: ANITRA AQUAS D on: 06/18/2024 11:44 AM   Modules accepted: Orders

## 2024-06-18 NOTE — Telephone Encounter (Signed)
 Spoke with patient and reviewed lung screening CT results. Lung Nodules seen on previous scans have remained stable in size and will continue to be followed at your yearly screening scan. Also noted was a mildly lobulated pancreatic tail. This was not noted on CT Abdomen done 08/2023. Pt advised we will send this result pt PCP to decide on further follow up imaging. Results/ plans faxed to PCP. Order placed for 12 month lung screening CT.

## 2024-11-28 ENCOUNTER — Encounter: Admitting: Family Medicine
